# Patient Record
Sex: Female | Born: 1955 | Race: White | Hispanic: No | Marital: Single | Smoking: Current every day smoker
Health system: Southern US, Community
[De-identification: ages and names within clinical notes are randomized; demographics above are authoritative.]

## PROBLEM LIST (undated history)

## (undated) DIAGNOSIS — Z720 Tobacco use: Secondary | ICD-10-CM

## (undated) DIAGNOSIS — Z9289 Personal history of other medical treatment: Secondary | ICD-10-CM

## (undated) DIAGNOSIS — C539 Malignant neoplasm of cervix uteri, unspecified: Secondary | ICD-10-CM

## (undated) DIAGNOSIS — E785 Hyperlipidemia, unspecified: Secondary | ICD-10-CM

## (undated) DIAGNOSIS — E119 Type 2 diabetes mellitus without complications: Secondary | ICD-10-CM

## (undated) DIAGNOSIS — E669 Obesity, unspecified: Secondary | ICD-10-CM

## (undated) DIAGNOSIS — I10 Essential (primary) hypertension: Secondary | ICD-10-CM

## (undated) DIAGNOSIS — I998 Other disorder of circulatory system: Secondary | ICD-10-CM

## (undated) DIAGNOSIS — R0601 Orthopnea: Secondary | ICD-10-CM

## (undated) DIAGNOSIS — J449 Chronic obstructive pulmonary disease, unspecified: Secondary | ICD-10-CM

## (undated) DIAGNOSIS — I70229 Atherosclerosis of native arteries of extremities with rest pain, unspecified extremity: Secondary | ICD-10-CM

## (undated) DIAGNOSIS — I739 Peripheral vascular disease, unspecified: Secondary | ICD-10-CM

## (undated) HISTORY — DX: Hyperlipidemia, unspecified: E78.5

## (undated) HISTORY — DX: Other disorder of circulatory system: I99.8

## (undated) HISTORY — DX: Obesity, unspecified: E66.9

## (undated) HISTORY — DX: Atherosclerosis of native arteries of extremities with rest pain, unspecified extremity: I70.229

## (undated) HISTORY — DX: Tobacco use: Z72.0

---

## 2004-04-24 DIAGNOSIS — Z9289 Personal history of other medical treatment: Secondary | ICD-10-CM

## 2004-04-24 HISTORY — PX: CERVIX SURGERY: SHX593

## 2004-04-24 HISTORY — DX: Personal history of other medical treatment: Z92.89

## 2011-11-13 ENCOUNTER — Other Ambulatory Visit (HOSPITAL_COMMUNITY): Payer: Self-pay | Admitting: Internal Medicine

## 2011-11-13 DIAGNOSIS — Z139 Encounter for screening, unspecified: Secondary | ICD-10-CM

## 2011-11-21 ENCOUNTER — Ambulatory Visit (HOSPITAL_COMMUNITY)
Admission: RE | Admit: 2011-11-21 | Discharge: 2011-11-21 | Disposition: A | Discharge status: Home / Self Care | Payer: Medicare Other | Source: Ambulatory Visit | Attending: Internal Medicine | Admitting: Internal Medicine

## 2011-11-21 DIAGNOSIS — Z139 Encounter for screening, unspecified: Secondary | ICD-10-CM

## 2011-11-21 DIAGNOSIS — Z1231 Encounter for screening mammogram for malignant neoplasm of breast: Secondary | ICD-10-CM | POA: Insufficient documentation

## 2012-02-18 ENCOUNTER — Emergency Department (HOSPITAL_COMMUNITY)
Admission: EM | Admit: 2012-02-18 | Discharge: 2012-02-18 | Disposition: A | Discharge status: Home / Self Care | Payer: Medicare Other | Attending: Emergency Medicine | Admitting: Emergency Medicine

## 2012-02-18 ENCOUNTER — Encounter (HOSPITAL_COMMUNITY): Payer: Self-pay | Admitting: Emergency Medicine

## 2012-02-18 DIAGNOSIS — H6691 Otitis media, unspecified, right ear: Secondary | ICD-10-CM

## 2012-02-18 DIAGNOSIS — J3489 Other specified disorders of nose and nasal sinuses: Secondary | ICD-10-CM | POA: Insufficient documentation

## 2012-02-18 DIAGNOSIS — R059 Cough, unspecified: Secondary | ICD-10-CM | POA: Insufficient documentation

## 2012-02-18 DIAGNOSIS — H6690 Otitis media, unspecified, unspecified ear: Secondary | ICD-10-CM | POA: Insufficient documentation

## 2012-02-18 DIAGNOSIS — E119 Type 2 diabetes mellitus without complications: Secondary | ICD-10-CM | POA: Insufficient documentation

## 2012-02-18 DIAGNOSIS — J449 Chronic obstructive pulmonary disease, unspecified: Secondary | ICD-10-CM | POA: Insufficient documentation

## 2012-02-18 DIAGNOSIS — R05 Cough: Secondary | ICD-10-CM | POA: Insufficient documentation

## 2012-02-18 DIAGNOSIS — I10 Essential (primary) hypertension: Secondary | ICD-10-CM | POA: Insufficient documentation

## 2012-02-18 DIAGNOSIS — F17200 Nicotine dependence, unspecified, uncomplicated: Secondary | ICD-10-CM | POA: Insufficient documentation

## 2012-02-18 DIAGNOSIS — Z859 Personal history of malignant neoplasm, unspecified: Secondary | ICD-10-CM | POA: Insufficient documentation

## 2012-02-18 DIAGNOSIS — J4489 Other specified chronic obstructive pulmonary disease: Secondary | ICD-10-CM | POA: Insufficient documentation

## 2012-02-18 HISTORY — DX: Chronic obstructive pulmonary disease, unspecified: J44.9

## 2012-02-18 HISTORY — DX: Essential (primary) hypertension: I10

## 2012-02-18 MED ORDER — CEFTRIAXONE SODIUM 1 G IJ SOLR
1.0000 g | Freq: Once | INTRAMUSCULAR | Status: AC
  Administered 2012-02-18: 1 g via INTRAMUSCULAR
  Filled 2012-02-18: qty 10

## 2012-02-18 MED ORDER — HYDROCOD POLST-CHLORPHEN POLST 10-8 MG/5ML PO LQCR
5.0000 mL | Freq: Once | ORAL | Status: AC
  Administered 2012-02-18: 5 mL via ORAL
  Filled 2012-02-18: qty 5

## 2012-02-18 MED ORDER — LIDOCAINE HCL (PF) 1 % IJ SOLN
INTRAMUSCULAR | Status: AC
  Administered 2012-02-18: 2.1 mL
  Filled 2012-02-18: qty 5

## 2012-02-18 MED ORDER — GUAIFENESIN-CODEINE 100-10 MG/5ML PO SYRP: ORAL_SOLUTION | ORAL | Status: DC

## 2012-02-18 MED ORDER — AZITHROMYCIN 250 MG PO TABS: ORAL_TABLET | ORAL | Status: DC

## 2012-02-18 MED ORDER — IBUPROFEN 800 MG PO TABS
800.0000 mg | ORAL_TABLET | Freq: Once | ORAL | Status: AC
  Administered 2012-02-18: 800 mg via ORAL
  Filled 2012-02-18: qty 1

## 2012-02-18 MED ORDER — AZITHROMYCIN 250 MG PO TABS
500.0000 mg | ORAL_TABLET | Freq: Once | ORAL | Status: AC
  Administered 2012-02-18: 500 mg via ORAL
  Filled 2012-02-18: qty 2

## 2012-02-18 NOTE — ED Provider Notes (Signed)
History     CSN: [id number]  Arrival date & time 02/18/12  1319   First MD Initiated Contact with Patient 02/18/12 1551      Chief Complaint  Patient presents with  . Nasal Congestion  . Otalgia    (Consider location/radiation/quality/duration/timing/severity/associated sxs/prior treatment) HPI Comments: Has PCP in [place].  Patient is a 56 y.o. female presenting with ear pain. The history is provided by the patient. No language interpreter was used.  Otalgia This is a new problem. Episode onset: 4 days ago. There is pain in the right ear. The problem occurs constantly. There has been no fever. The pain is moderate. Associated symptoms include headaches, hearing loss and cough. Pertinent negatives include no ear discharge, no sore throat and no vomiting. Associated symptoms comments: Back pain from coughing and post-tussive retching..    Past Medical History  Diagnosis Date  . Hypertension   . Diabetes mellitus without complication   . COPD (chronic obstructive pulmonary disease)   . Cancer     History reviewed. No pertinent past surgical history.  No family history on file.  History  Substance Use Topics  . Smoking status: Current Every Day Smoker -- 0.5 packs/day  . Smokeless tobacco: Not on file  . Alcohol Use: No    OB History    Grav Para Term Preterm Abortions TAB SAB Ect Mult Living                  Review of Systems  Constitutional: Positive for chills. Negative for fever.  HENT: Positive for hearing loss, ear pain and congestion. Negative for sore throat and ear discharge.   Respiratory: Positive for cough. Negative for shortness of breath and wheezing.   Gastrointestinal: Negative for nausea and vomiting.  Musculoskeletal: Positive for back pain.  Neurological: Positive for headaches.  All other systems reviewed and are negative.    Allergies  Review of patient's allergies indicates no known allergies.  Home Medications   Current Outpatient Rx    Name Route Sig Dispense Refill  . ALBUTEROL SULFATE HFA 108 (90 BASE) MCG/ACT IN AERS Inhalation Inhale 2 puffs into the lungs every 6 (six) hours as needed.    [name] LISINOPRIL 10 MG PO TABS Oral Take 10 mg by mouth daily.    [name] METFORMIN HCL 1000 MG PO TABS Oral Take 1,000 mg by mouth 3 (three) times daily.    [name] TIOTROPIUM BROMIDE MONOHYDRATE 18 MCG IN CAPS Inhalation Place 18 mcg into inhaler and inhale daily.    . AZITHROMYCIN 250 MG PO TABS  One tab po QD (initial dose given in ED) 4 tablet 0  . GUAIFENESIN-CODEINE 100-10 MG/5ML PO SYRP  10 ml po q 4-6 hrs prn cough 240 mL 0    BP 137/76  Pulse 73  Temp 98.1 F (36.7 C)  Resp 16  Ht 5\' 8"  (1.727 m)  Wt 209 lb (94.802 kg)  BMI 31.78 kg/m2  SpO2 93%  Physical Exam  Nursing note and vitals reviewed. Constitutional: She is oriented to person, place, and time. Vital signs are normal. She appears well-developed and well-nourished. She is cooperative.  Non-toxic appearance. She does not have a sickly appearance. She appears ill. No distress.  HENT:  Head: Normocephalic and atraumatic.  Right Ear: External ear and ear canal normal. No drainage. Tympanic membrane is injected and bulging. A middle ear effusion is present. Decreased hearing is noted.  Left Ear: Hearing, tympanic membrane, external ear and ear canal normal.  Nose:  Nose normal.  Mouth/Throat: No oropharyngeal exudate.  Eyes: EOM are normal.  Neck: Normal range of motion.  Cardiovascular: Normal rate, regular rhythm and normal heart sounds.   Pulmonary/Chest: Breath sounds normal. No accessory muscle usage. Tachypnea noted. No respiratory distress. She has no decreased breath sounds. She has no wheezes. She has no rhonchi. She has no rales. She exhibits no tenderness.  Abdominal: Soft. She exhibits no distension. There is no tenderness.  Musculoskeletal: Normal range of motion.  Neurological: She is alert and oriented to person, place, and time.  Skin: Skin is warm and dry.   Psychiatric: She has a normal mood and affect. Judgment normal.    ED Course  Procedures (including critical care time)  Labs Reviewed - No data to display No results found.   1. Right otitis media   2. Cough       MDM  rx- zithromax 250 mg x 4 days Rocephin 1 gm IM rx-robitussin AC Ibuprofen 800 mg TID F/u with PCP        Evalina Field, PA 02/18/12 1626

## 2012-02-18 NOTE — ED Notes (Signed)
Cough, congestion and diminished hearing R ear x 4days.  Lungs clear but diminished bilaterally.  R ear slightly erythematous canal and area on TM. ? Serous drainage at 5:00 margin of TM.

## 2012-02-18 NOTE — ED Provider Notes (Signed)
Medical screening examination/treatment/procedure(s) were performed by non-physician practitioner and as supervising physician I was immediately available for consultation/collaboration.   Gracianna Vink L Kalil Woessner, MD 02/18/12 2245 

## 2012-02-18 NOTE — ED Notes (Signed)
Patient with no complaints at this time. Respirations even and unlabored. Skin warm/dry. Discharge instructions reviewed with patient at this time. Patient given opportunity to voice concerns/ask questions. Patient discharged at this time and left Emergency Department with steady gait.   

## 2012-02-18 NOTE — ED Notes (Signed)
Pt c/o head/nasal congestion and right ear pain x 1 week

## 2012-11-05 ENCOUNTER — Other Ambulatory Visit (HOSPITAL_COMMUNITY): Payer: Self-pay | Admitting: Internal Medicine

## 2012-11-05 DIAGNOSIS — Z139 Encounter for screening, unspecified: Secondary | ICD-10-CM

## 2012-11-25 ENCOUNTER — Ambulatory Visit (HOSPITAL_COMMUNITY)
Admission: RE | Admit: 2012-11-25 | Discharge: 2012-11-25 | Disposition: A | Discharge status: Home / Self Care | Payer: Medicare Other | Source: Ambulatory Visit | Attending: Internal Medicine | Admitting: Internal Medicine

## 2012-11-25 DIAGNOSIS — Z139 Encounter for screening, unspecified: Secondary | ICD-10-CM

## 2012-11-25 DIAGNOSIS — Z1231 Encounter for screening mammogram for malignant neoplasm of breast: Secondary | ICD-10-CM | POA: Insufficient documentation

## 2013-03-24 ENCOUNTER — Ambulatory Visit (INDEPENDENT_AMBULATORY_CARE_PROVIDER_SITE_OTHER): Payer: Medicare Other | Admitting: Podiatry

## 2013-03-24 ENCOUNTER — Encounter: Payer: Self-pay | Admitting: Podiatry

## 2013-03-24 VITALS — BP 114/62 | HR 97 | Resp 22 | Ht 68.0 in | Wt 240.0 lb

## 2013-03-24 DIAGNOSIS — E1159 Type 2 diabetes mellitus with other circulatory complications: Secondary | ICD-10-CM

## 2013-03-24 DIAGNOSIS — B351 Tinea unguium: Secondary | ICD-10-CM

## 2013-03-24 DIAGNOSIS — M79609 Pain in unspecified limb: Secondary | ICD-10-CM

## 2013-03-24 NOTE — Progress Notes (Signed)
   Subjective:    Patient ID: Jackie Evans, female    DOB: Jan 22, 1956, 57 y.o.   MRN: [id number]  HPI Comments: "My right foot just gets red and burns and stings. It's kinda splotchy"  N - burning, stinging L - left foot  D - few mos O - gradual C - red, splotchy areas, worse A - shoes T - PCP gave "cream"? (no help)  -Pt has trouble trimming toenails too-       Review of Systems  Respiratory: Positive for shortness of breath.   Cardiovascular: Positive for leg swelling.  All other systems reviewed and are negative.       Objective:   Physical Exam        Assessment & Plan:

## 2013-03-24 NOTE — Patient Instructions (Signed)
Diabetes and Foot Care Diabetes may cause you to have problems because of poor blood supply (circulation) to your feet and legs. This may cause the skin on your feet to become thinner, break easier, and heal more slowly. Your skin may become dry, and the skin may peel and crack. You may also have nerve damage in your legs and feet causing decreased feeling in them. You may not notice minor injuries to your feet that could lead to infections or more serious problems. Taking care of your feet is one of the most important things you can do for yourself.  HOME CARE INSTRUCTIONS  Wear shoes at all times, even in the house. Do not go barefoot. Bare feet are easily injured.  Check your feet daily for blisters, cuts, and redness. If you cannot see the bottom of your feet, use a mirror or ask someone for help.  Wash your feet with warm water (do not use hot water) and mild soap. Then pat your feet and the areas between your toes until they are completely dry. Do not soak your feet as this can dry your skin.  Apply a moisturizing lotion or petroleum jelly (that does not contain alcohol and is unscented) to the skin on your feet and to dry, brittle toenails. Do not apply lotion between your toes.  Trim your toenails straight across. Do not dig under them or around the cuticle. File the edges of your nails with an emery board or nail file.  Do not cut corns or calluses or try to remove them with medicine.  Wear clean socks or stockings every day. Make sure they are not too tight. Do not wear knee-high stockings since they may decrease blood flow to your legs.  Wear shoes that fit properly and have enough cushioning. To break in new shoes, wear them for just a few hours a day. This prevents you from injuring your feet. Always look in your shoes before you put them on to be sure there are no objects inside.  Do not cross your legs. This may decrease the blood flow to your feet.  If you find a minor scrape,  cut, or break in the skin on your feet, keep it and the skin around it clean and dry. These areas may be cleansed with mild soap and water. Do not cleanse the area with peroxide, alcohol, or iodine.  When you remove an adhesive bandage, be sure not to damage the skin around it.  If you have a wound, look at it several times a day to make sure it is healing.  Do not use heating pads or hot water bottles. They may burn your skin. If you have lost feeling in your feet or legs, you may not know it is happening until it is too late.  Make sure your health care provider performs a complete foot exam at least annually or more often if you have foot problems. Report any cuts, sores, or bruises to your health care provider immediately. SEEK MEDICAL CARE IF:   You have an injury that is not healing.  You have cuts or breaks in the skin.  You have an ingrown nail.  You notice redness on your legs or feet.  You feel burning or tingling in your legs or feet.  You have pain or cramps in your legs and feet.  Your legs or feet are numb.  Your feet always feel cold. SEEK IMMEDIATE MEDICAL CARE IF:   There is increasing redness,   swelling, or pain in or around a wound.  There is a red line that goes up your leg.  Pus is coming from a wound.  You develop a fever or as directed by your health care provider.  You notice a bad smell coming from an ulcer or wound. Document Released: 04/07/2000 Document Revised: 12/11/2012 Document Reviewed: 09/17/2012 Exit[employer] Patient Information 2014 [employer].  

## 2013-03-26 NOTE — Progress Notes (Signed)
Subjective:     Patient ID: Jackie Evans, female   DOB: 04-04-1956, 57 y.o.   MRN: [id number]  Foot Pain   patient presents in very poor health using oxygen and a history of diabetes. She is getting some red splotchy spots on her feet and is concerned because her left foot and lower leg is becoming painful. Also complains about her nails that she cannot cut them and they get tender in the corners   Review of Systems  All other systems reviewed and are negative.       Objective:   Physical Exam  Nursing note and vitals reviewed. Constitutional: She is oriented to person, place, and time. She appears well-nourished.  Musculoskeletal: Normal range of motion.  Neurological: She is oriented to person, place, and time.  Skin: Skin is dry.   neurovascular status is diminished left over right with nonpalpable pulses on the left side and coolness to the foot noted muscle strength is diminished and equinus condition noted of both feet. Red discoloration of the foot noted left over right with diminishment appear growth noted on both feet. Nail disease with thickness 1-5 both feet     Assessment:     At risk diabetic with possibility for vascular disease of the left leg which may be causing discoloration and nail disease 1-5 both feet with pain    Plan:     H&P performed and conditions discussed and explained to patient. I have requested vascular evaluation of the left lower extremity with possibility for vascular disease present debrided nailbeds 1-5 both feet and advised if she does not hear from the vascular testing center within the next 5-7 days to contact us back for appointment

## 2013-03-27 ENCOUNTER — Ambulatory Visit (INDEPENDENT_AMBULATORY_CARE_PROVIDER_SITE_OTHER): Payer: Medicare Other | Admitting: Cardiovascular Disease

## 2013-03-27 ENCOUNTER — Encounter (HOSPITAL_COMMUNITY): Payer: Self-pay | Admitting: Cardiovascular Disease

## 2013-03-27 ENCOUNTER — Encounter: Payer: Self-pay | Admitting: Cardiovascular Disease

## 2013-03-27 ENCOUNTER — Telehealth: Payer: Self-pay | Admitting: *Deleted

## 2013-03-27 ENCOUNTER — Ambulatory Visit (HOSPITAL_COMMUNITY)
Admission: RE | Admit: 2013-03-27 | Discharge: 2013-03-27 | Disposition: A | Discharge status: Home / Self Care | Payer: Medicare Other | Source: Ambulatory Visit | Attending: Podiatry | Admitting: Podiatry

## 2013-03-27 VITALS — BP 148/62 | HR 96 | Ht 68.0 in | Wt 241.0 lb

## 2013-03-27 DIAGNOSIS — E1159 Type 2 diabetes mellitus with other circulatory complications: Secondary | ICD-10-CM

## 2013-03-27 DIAGNOSIS — R5383 Other fatigue: Secondary | ICD-10-CM

## 2013-03-27 DIAGNOSIS — Z01818 Encounter for other preprocedural examination: Secondary | ICD-10-CM

## 2013-03-27 DIAGNOSIS — E119 Type 2 diabetes mellitus without complications: Secondary | ICD-10-CM | POA: Insufficient documentation

## 2013-03-27 DIAGNOSIS — I10 Essential (primary) hypertension: Secondary | ICD-10-CM | POA: Insufficient documentation

## 2013-03-27 DIAGNOSIS — R5381 Other malaise: Secondary | ICD-10-CM

## 2013-03-27 DIAGNOSIS — I739 Peripheral vascular disease, unspecified: Secondary | ICD-10-CM | POA: Insufficient documentation

## 2013-03-27 DIAGNOSIS — I998 Other disorder of circulatory system: Secondary | ICD-10-CM | POA: Insufficient documentation

## 2013-03-27 DIAGNOSIS — M79609 Pain in unspecified limb: Secondary | ICD-10-CM | POA: Insufficient documentation

## 2013-03-27 DIAGNOSIS — I70219 Atherosclerosis of native arteries of extremities with intermittent claudication, unspecified extremity: Secondary | ICD-10-CM

## 2013-03-27 DIAGNOSIS — D689 Coagulation defect, unspecified: Secondary | ICD-10-CM

## 2013-03-27 DIAGNOSIS — J449 Chronic obstructive pulmonary disease, unspecified: Secondary | ICD-10-CM

## 2013-03-27 DIAGNOSIS — E785 Hyperlipidemia, unspecified: Secondary | ICD-10-CM | POA: Insufficient documentation

## 2013-03-27 DIAGNOSIS — Z79899 Other long term (current) drug therapy: Secondary | ICD-10-CM

## 2013-03-27 DIAGNOSIS — R0989 Other specified symptoms and signs involving the circulatory and respiratory systems: Secondary | ICD-10-CM

## 2013-03-27 NOTE — Assessment & Plan Note (Signed)
Controlled on current medications 

## 2013-03-27 NOTE — Assessment & Plan Note (Signed)
40-80 pack years currently smoking one half pack per day. Oxygen dependent.

## 2013-03-27 NOTE — Assessment & Plan Note (Signed)
On statin therapy followed by her PCP 

## 2013-03-27 NOTE — Assessment & Plan Note (Signed)
1-2 month history of left lower extremity pain both at rest and with ambulation. She has dependent rubor. She is was referred by Dr. Cristie Hem , from [facility], for further peripheral vascular evaluation. Lower extremity arterial Dopplers performed today revealed a right ABI of 0.90 L. 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 She has no palpable left femoral pulse.

## 2013-03-27 NOTE — Telephone Encounter (Signed)
Rita - [facility] on [address] states pt has + arterial doppler test of left leg, would Dr Charlsie Merles like Dr Allyson Sabal to see.  I informed Trego Sink, DR Charlsie Merles agreed.  I informed Dr. Charlsie Merles.

## 2013-03-27 NOTE — Progress Notes (Signed)
Arterial Duplex Left Lower Ext. Completed. Jaryn Hocutt, BS, RDMS, RVT  

## 2013-03-27 NOTE — Progress Notes (Signed)
   03/27/2013 Jackie Evans   09/02/1955  [id number]  Primary Physician AVBUERE,EDWIN A, MD Primary Cardiologist: Charlina Dwight J. Ziyanna Tolin MD FACP,FACC,FAHA, FSCAI   HPI:  Jackie Evans is a 57-year-old moderately overweight divorced Caucasian female mother of 4, grandmother to 11 grandchildren who is referred by Dr. Regal for peripheral vascular evaluation because of critical limb ischemia. Her primary care physician is Dr. Edwin Avbeure. Her risk factors include tobacco abuse of 40-80 pack years with COPD. She does continue to smoke one half pack per day. She is treated diabetes, hypertension and hyperlipidemia. She's never had a heart attack or stroke. She denies chest pain but is short of breath. She's had left lower extremity discomfort for one to 2 months but the rest and with exertion. Her left ABI is 0.66.   Current Outpatient Prescriptions  Medication Sig Dispense Refill  . Albuterol Sulfate (PROAIR HFA IN) Inhale into the lungs.      . AMITRIPTYLINE HCL PO Take by mouth.      . gabapentin (NEURONTIN) 300 MG capsule Take 300 mg by mouth 3 (three) times daily.      . glipiZIDE (GLUCOTROL XL) 5 MG 24 hr tablet Take 5 mg by mouth daily with breakfast.      . lisinopril-hydrochlorothiazide (PRINZIDE,ZESTORETIC) 20-12.5 MG per tablet       . metFORMIN (GLUCOPHAGE) 1000 MG tablet Take 500-1,000 mg by mouth 3 (three) times daily. Patient takes 1 tablet in the morning, 1/2 tablet in the evening and 1 tablet at bedtime      . simvastatin (ZOCOR) 10 MG tablet Take 10 mg by mouth daily.       No current facility-administered medications for this visit.    No Known Allergies  History   Social History  . Marital Status: Single    Spouse Name: N/A    Number of Children: N/A  . Years of Education: N/A   Occupational History  . Not on file.   Social History Main Topics  . Smoking status: Current Every Day Smoker -- 0.50 packs/day  . Smokeless tobacco: Not on file  . Alcohol Use: No  . Drug  Use: No  . Sexual Activity:    Other Topics Concern  . Not on file   Social History Narrative  . No narrative on file     Review of Systems: General: negative for chills, fever, night sweats or weight changes.  Cardiovascular: negative for chest pain, dyspnea on exertion, edema, orthopnea, palpitations, paroxysmal nocturnal dyspnea or shortness of breath Dermatological: negative for rash Respiratory: negative for cough or wheezing Urologic: negative for hematuria Abdominal: negative for nausea, vomiting, diarrhea, bright red blood per rectum, melena, or hematemesis Neurologic: negative for visual changes, syncope, or dizziness All other systems reviewed and are otherwise negative except as noted above.    Blood pressure 148/62, pulse 96, height 5' 8" (1.727 m), weight 241 lb (109.317 kg).  General appearance: alert and no distress Neck: no adenopathy, no carotid bruit, no JVD, supple, symmetrical, trachea midline and thyroid not enlarged, symmetric, no tenderness/mass/nodules Lungs: clear to auscultation bilaterally Heart: regular rate and rhythm, S1, S2 normal, no murmur, click, rub or gallop Extremities: 2+ right and absent left pedal pulse. There was dependent rubor on the left. Pulses: 2+ right, absent left.  EKG not performed today  ASSESSMENT AND PLAN:   COPD (chronic obstructive pulmonary disease) 40-80 pack years currently smoking one half pack per day. Oxygen dependent.  Hyperlipidemia On statin therapy followed by   her PCP  Essential hypertension Controlled on current medications  Critical lower limb ischemia 1-2 month history of left lower extremity pain both at rest and with ambulation. She has dependent rubor. She is was referred by Dr. Norman Regal , from [facility], for further peripheral vascular evaluation. Lower extremity arterial Dopplers performed today revealed a right ABI of 0.90 L. 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 She has no palpable left femoral pulse.      [name] J. Demitris Pokorny MD FACP,FACC,FAHA, FSCAI 03/27/2013 3:12 PM  

## 2013-03-27 NOTE — Patient Instructions (Signed)
Dr. Allyson Sabal has ordered a peripheral angiogram to be done at [facility].  This procedure is going to look at the bloodflow in your lower extremities.  If Dr. Allyson Sabal is able to open up the arteries, you will have to spend one night in the hospital.  If he is not able to open the arteries, you will be able to go home that same day.    After the procedure, you will not be allowed to drive for 3 days or push, pull, or lift anything greater than 10 lbs for one week.    You will be required to have bloodwork and a chest xray prior to your procedure.  Our scheduler will advise you on when these items need to be done.    REPS Scott     Dr Allyson Sabal wants you to have an echocardiogram and lexiscan myoview prior to the angiogram.

## 2013-03-28 ENCOUNTER — Telehealth (HOSPITAL_COMMUNITY): Payer: Self-pay | Admitting: *Deleted

## 2013-04-03 ENCOUNTER — Ambulatory Visit (HOSPITAL_COMMUNITY)
Admission: RE | Admit: 2013-04-03 | Discharge: 2013-04-03 | Disposition: A | Discharge status: Home / Self Care | Payer: Medicare Other | Source: Ambulatory Visit | Attending: Cardiovascular Disease | Admitting: Cardiovascular Disease

## 2013-04-03 ENCOUNTER — Ambulatory Visit (HOSPITAL_COMMUNITY)
Admission: RE | Admit: 2013-04-03 | Discharge: 2013-04-03 | Disposition: A | Discharge status: Home / Self Care | Payer: Medicare Other | Source: Ambulatory Visit | Attending: Internal Medicine | Admitting: Internal Medicine

## 2013-04-03 ENCOUNTER — Encounter (HOSPITAL_COMMUNITY): Payer: Self-pay | Admitting: Pharmacy Technician

## 2013-04-03 DIAGNOSIS — R079 Chest pain, unspecified: Secondary | ICD-10-CM | POA: Insufficient documentation

## 2013-04-03 DIAGNOSIS — R0609 Other forms of dyspnea: Secondary | ICD-10-CM | POA: Insufficient documentation

## 2013-04-03 DIAGNOSIS — F17200 Nicotine dependence, unspecified, uncomplicated: Secondary | ICD-10-CM | POA: Insufficient documentation

## 2013-04-03 DIAGNOSIS — R5381 Other malaise: Secondary | ICD-10-CM | POA: Insufficient documentation

## 2013-04-03 DIAGNOSIS — E669 Obesity, unspecified: Secondary | ICD-10-CM | POA: Insufficient documentation

## 2013-04-03 DIAGNOSIS — R0989 Other specified symptoms and signs involving the circulatory and respiratory systems: Secondary | ICD-10-CM | POA: Insufficient documentation

## 2013-04-03 DIAGNOSIS — J4489 Other specified chronic obstructive pulmonary disease: Secondary | ICD-10-CM | POA: Insufficient documentation

## 2013-04-03 DIAGNOSIS — E119 Type 2 diabetes mellitus without complications: Secondary | ICD-10-CM | POA: Insufficient documentation

## 2013-04-03 DIAGNOSIS — Z01810 Encounter for preprocedural cardiovascular examination: Secondary | ICD-10-CM

## 2013-04-03 DIAGNOSIS — R42 Dizziness and giddiness: Secondary | ICD-10-CM | POA: Insufficient documentation

## 2013-04-03 DIAGNOSIS — I739 Peripheral vascular disease, unspecified: Secondary | ICD-10-CM

## 2013-04-03 DIAGNOSIS — Z01818 Encounter for other preprocedural examination: Secondary | ICD-10-CM

## 2013-04-03 DIAGNOSIS — J449 Chronic obstructive pulmonary disease, unspecified: Secondary | ICD-10-CM

## 2013-04-03 DIAGNOSIS — I10 Essential (primary) hypertension: Secondary | ICD-10-CM | POA: Insufficient documentation

## 2013-04-03 LAB — BASIC METABOLIC PANEL
BUN: 13 mg/dL (ref 6–23)
Calcium: 9.8 mg/dL (ref 8.4–10.5)
Glucose, Bld: 144 mg/dL — ABNORMAL HIGH (ref 70–99)
Sodium: 140 mEq/L (ref 135–145)

## 2013-04-03 LAB — CBC
HCT: 39 % (ref 36.0–46.0)
Platelets: 183 10*3/uL (ref 150–400)
RDW: 15.7 % — ABNORMAL HIGH (ref 11.5–15.5)
WBC: 7 10*3/uL (ref 4.0–10.5)

## 2013-04-03 LAB — PROTIME-INR
INR: 0.95 (ref <1.50)
Prothrombin Time: 12.6 seconds (ref 11.6–15.2)

## 2013-04-03 MED ORDER — AMINOPHYLLINE 25 MG/ML IV SOLN
75.0000 mg | Freq: Once | INTRAVENOUS | Status: AC
  Administered 2013-04-03: 75 mg via INTRAVENOUS

## 2013-04-03 MED ORDER — TECHNETIUM TC 99M SESTAMIBI GENERIC - CARDIOLITE
30.0000 | Freq: Once | INTRAVENOUS | Status: AC | PRN
  Administered 2013-04-03: 30 via INTRAVENOUS

## 2013-04-03 MED ORDER — REGADENOSON 0.4 MG/5ML IV SOLN
0.4000 mg | Freq: Once | INTRAVENOUS | Status: AC
  Administered 2013-04-03: 0.4 mg via INTRAVENOUS

## 2013-04-03 MED ORDER — TECHNETIUM TC 99M SESTAMIBI GENERIC - CARDIOLITE
10.0000 | Freq: Once | INTRAVENOUS | Status: AC | PRN
  Administered 2013-04-03: 10 via INTRAVENOUS

## 2013-04-03 NOTE — Procedures (Addendum)
[facility] [address] [place] [address] [place] [address] [phone]  Cardiology Nuclear Med Study  Jackie Evans is a 57 y.o. female     MRN : [id number]     DOB: 12-05-1955  Procedure Date: 04/03/2013  Nuclear Med Background Indication for Stress Test:  Surgical Clearance History:  COPD Cardiac Risk Factors: Hypertension, Lipids, NIDDM, Obesity, PVD and Smoker  Symptoms:  Chest Pain, DOE, Fatigue, Light-Headedness and SOB   Nuclear Pre-Procedure Caffeine/Decaff Intake:  8:00pm NPO After: 6:00am   IV Site: R Forearm  IV 0.9% NS with Angio Cath:  22g  Chest Size (in):  N/A IV Started by: Emmit Pomfret, RN  Height: 5\' 8"  (1.727 m)  Cup Size: C  BMI:  Body mass index is 36.65 kg/(m^2). Weight:  241 lb (109.317 kg)   Tech Comments:  N/A    Nuclear Med Study 1 or 2 day study: 1 day  Stress Test Type:  Lexiscan  Order Authorizing Provider:  Nanetta Batty, MD   Resting Radionuclide: Technetium 34m Sestamibi  Resting Radionuclide Dose: 9.6 mCi   Stress Radionuclide:  Technetium 64m Sestamibi  Stress Radionuclide Dose: 29.1 mCi           Stress Protocol Rest HR: 68 Stress HR: 99  Rest BP: 126/73 Stress BP: 150/72  Exercise Time (min): n/a METS: n/a   Predicted Max HR: 163 bpm % Max HR: 60.74 bpm Rate Pressure Product: 34742  Dose of Adenosine (mg):  n/a Dose of Lexiscan: 0.4 mg  Dose of Atropine (mg): n/a Dose of Dobutamine: n/a mcg/kg/min (at max HR)  Stress Test Technologist: Esperanza Sheets, CCT Nuclear Technologist: Koren Shiver, CNMT   Rest Procedure:  Myocardial perfusion imaging was performed at rest 45 minutes following the intravenous administration of Technetium 72m Sestamibi. Stress Procedure:  The patient received IV Lexiscan 0.4 mg over 15-seconds.  Technetium 53m Sestamibi injected at 30-seconds.  The patient experienced marked SOB; 125 mg IV Aminophylline with resolution of symptoms.  There were no  significant changes with Lexiscan.  Quantitative spect images were obtained after a 45 minute delay.  Transient Ischemic Dilatation (Normal <1.22):  0.86 Lung/Heart Ratio (Normal <0.45):  0.25 QGS EDV:  61 ml QGS ESV:  8 ml LV Ejection Fraction: 87%  Signed by      Rest ECG: NSR - Normal EKG  Stress ECG: No significant change from baseline ECG  QPS Raw Data Images:  Normal; no motion artifact; normal heart/lung ratio. Stress Images:  Normal homogeneous uptake in all areas of the myocardium. Rest Images:  Normal homogeneous uptake in all areas of the myocardium. Subtraction (SDS):  Normal  Impression Exercise Capacity:  Lexiscan with no exercise. BP Response:  Normal blood pressure response. Clinical Symptoms:  Marked shortness of breath ECG Impression:  No significant ST segment change suggestive of ischemia. Comparison with Prior Nuclear Study: No images to compare  Overall Impression:  Normal stress nuclear study.  LV Wall Motion:  NL LV Function; NL Wall Motion   Lennette Bihari, MD  04/03/2013 12:47 PM

## 2013-04-07 ENCOUNTER — Encounter: Payer: Self-pay | Admitting: *Deleted

## 2013-04-08 ENCOUNTER — Encounter (HOSPITAL_COMMUNITY): Payer: Self-pay | Admitting: General Practice

## 2013-04-08 ENCOUNTER — Encounter (HOSPITAL_COMMUNITY)
Admission: RE | Disposition: A | Discharge status: Home / Self Care | Payer: Self-pay | Source: Ambulatory Visit | Attending: Cardiovascular Disease

## 2013-04-08 ENCOUNTER — Ambulatory Visit (HOSPITAL_COMMUNITY)
Admission: RE | Admit: 2013-04-08 | Discharge: 2013-04-09 | Disposition: A | Discharge status: Home / Self Care | Payer: Medicare Other | Source: Ambulatory Visit | Attending: Cardiovascular Disease | Admitting: Cardiovascular Disease

## 2013-04-08 DIAGNOSIS — E663 Overweight: Secondary | ICD-10-CM | POA: Insufficient documentation

## 2013-04-08 DIAGNOSIS — Z959 Presence of cardiac and vascular implant and graft, unspecified: Secondary | ICD-10-CM

## 2013-04-08 DIAGNOSIS — I998 Other disorder of circulatory system: Secondary | ICD-10-CM

## 2013-04-08 DIAGNOSIS — Z01818 Encounter for other preprocedural examination: Secondary | ICD-10-CM

## 2013-04-08 DIAGNOSIS — I70219 Atherosclerosis of native arteries of extremities with intermittent claudication, unspecified extremity: Secondary | ICD-10-CM | POA: Insufficient documentation

## 2013-04-08 DIAGNOSIS — E785 Hyperlipidemia, unspecified: Secondary | ICD-10-CM | POA: Insufficient documentation

## 2013-04-08 DIAGNOSIS — J4489 Other specified chronic obstructive pulmonary disease: Secondary | ICD-10-CM | POA: Insufficient documentation

## 2013-04-08 DIAGNOSIS — I745 Embolism and thrombosis of iliac artery: Secondary | ICD-10-CM | POA: Insufficient documentation

## 2013-04-08 DIAGNOSIS — E119 Type 2 diabetes mellitus without complications: Secondary | ICD-10-CM | POA: Insufficient documentation

## 2013-04-08 DIAGNOSIS — I10 Essential (primary) hypertension: Secondary | ICD-10-CM

## 2013-04-08 DIAGNOSIS — J449 Chronic obstructive pulmonary disease, unspecified: Secondary | ICD-10-CM | POA: Diagnosis present

## 2013-04-08 DIAGNOSIS — F17200 Nicotine dependence, unspecified, uncomplicated: Secondary | ICD-10-CM | POA: Insufficient documentation

## 2013-04-08 DIAGNOSIS — I739 Peripheral vascular disease, unspecified: Secondary | ICD-10-CM | POA: Insufficient documentation

## 2013-04-08 HISTORY — DX: Peripheral vascular disease, unspecified: I73.9

## 2013-04-08 HISTORY — DX: Malignant neoplasm of cervix uteri, unspecified: C53.9

## 2013-04-08 HISTORY — DX: Personal history of other medical treatment: Z92.89

## 2013-04-08 HISTORY — DX: Type 2 diabetes mellitus without complications: E11.9

## 2013-04-08 HISTORY — PX: ILIAC ARTERY STENT: SHX1786

## 2013-04-08 HISTORY — DX: Orthopnea: R06.01

## 2013-04-08 HISTORY — PX: LOWER EXTREMITY ANGIOGRAM: SHX5508

## 2013-04-08 LAB — POCT ACTIVATED CLOTTING TIME
Activated Clotting Time: 188 seconds
Activated Clotting Time: 215 seconds
Activated Clotting Time: 243 seconds
Activated Clotting Time: 138 seconds

## 2013-04-08 LAB — GLUCOSE, CAPILLARY
Glucose-Capillary: 182 mg/dL — ABNORMAL HIGH (ref 70–99)
Glucose-Capillary: 109 mg/dL — ABNORMAL HIGH (ref 70–99)
Glucose-Capillary: 142 mg/dL — ABNORMAL HIGH (ref 70–99)
Glucose-Capillary: 151 mg/dL — ABNORMAL HIGH (ref 70–99)

## 2013-04-08 SURGERY — ANGIOGRAM, LOWER EXTREMITY
Anesthesia: LOCAL

## 2013-04-08 MED ORDER — HYDRALAZINE HCL 20 MG/ML IJ SOLN: 10.0000 mg | Freq: Once | INTRAMUSCULAR | Status: DC

## 2013-04-08 MED ORDER — GLIPIZIDE ER 5 MG PO TB24
5.0000 mg | ORAL_TABLET | Freq: Every day | ORAL | Status: DC
  Administered 2013-04-09: 5 mg via ORAL
  Filled 2013-04-08 (×2): qty 1

## 2013-04-08 MED ORDER — ASPIRIN 81 MG PO CHEW
CHEWABLE_TABLET | ORAL | Status: AC
  Filled 2013-04-08: qty 1

## 2013-04-08 MED ORDER — MORPHINE SULFATE 2 MG/ML IJ SOLN
2.0000 mg | INTRAMUSCULAR | Status: DC | PRN
  Administered 2013-04-08 (×4): 2 mg via INTRAVENOUS
  Filled 2013-04-08 (×4): qty 1

## 2013-04-08 MED ORDER — HYDROCHLOROTHIAZIDE 12.5 MG PO CAPS
12.5000 mg | ORAL_CAPSULE | Freq: Two times a day (BID) | ORAL | Status: DC
  Administered 2013-04-08 – 2013-04-09 (×2): 12.5 mg via ORAL
  Filled 2013-04-08 (×4): qty 1

## 2013-04-08 MED ORDER — CLOPIDOGREL BISULFATE 75 MG PO TABS
75.0000 mg | ORAL_TABLET | Freq: Every day | ORAL | Status: DC
  Administered 2013-04-09: 09:00:00 75 mg via ORAL
  Filled 2013-04-08: qty 1

## 2013-04-08 MED ORDER — FENTANYL CITRATE 0.05 MG/ML IJ SOLN
INTRAMUSCULAR | Status: AC
  Filled 2013-04-08: qty 2

## 2013-04-08 MED ORDER — SODIUM CHLORIDE 0.9 % IJ SOLN: 3.0000 mL | INTRAMUSCULAR | Status: DC | PRN

## 2013-04-08 MED ORDER — HEPARIN SODIUM (PORCINE) 1000 UNIT/ML IJ SOLN
INTRAMUSCULAR | Status: AC
  Filled 2013-04-08: qty 1

## 2013-04-08 MED ORDER — LIDOCAINE HCL (PF) 1 % IJ SOLN
INTRAMUSCULAR | Status: AC
  Filled 2013-04-08: qty 30

## 2013-04-08 MED ORDER — CLOPIDOGREL BISULFATE 300 MG PO TABS
ORAL_TABLET | ORAL | Status: AC
  Filled 2013-04-08: qty 1

## 2013-04-08 MED ORDER — LISINOPRIL-HYDROCHLOROTHIAZIDE 20-12.5 MG PO TABS: 1.0000 | ORAL_TABLET | Freq: Two times a day (BID) | ORAL | Status: DC

## 2013-04-08 MED ORDER — ASPIRIN 81 MG PO CHEW
81.0000 mg | CHEWABLE_TABLET | ORAL | Status: AC
  Administered 2013-04-08: 81 mg via ORAL

## 2013-04-08 MED ORDER — GABAPENTIN 300 MG PO CAPS
300.0000 mg | ORAL_CAPSULE | Freq: Three times a day (TID) | ORAL | Status: DC
  Administered 2013-04-08 – 2013-04-09 (×4): 300 mg via ORAL
  Filled 2013-04-08 (×6): qty 1

## 2013-04-08 MED ORDER — ACETAMINOPHEN 325 MG PO TABS: 650.0000 mg | ORAL_TABLET | ORAL | Status: DC | PRN

## 2013-04-08 MED ORDER — ALBUTEROL SULFATE HFA 108 (90 BASE) MCG/ACT IN AERS
1.0000 | INHALATION_SPRAY | Freq: Four times a day (QID) | RESPIRATORY_TRACT | Status: DC | PRN
  Filled 2013-04-08 (×3): qty 6.7

## 2013-04-08 MED ORDER — DIAZEPAM 5 MG PO TABS
5.0000 mg | ORAL_TABLET | ORAL | Status: AC
  Administered 2013-04-08: 5 mg via ORAL

## 2013-04-08 MED ORDER — SODIUM CHLORIDE 0.9 % IV SOLN
INTRAVENOUS | Status: DC
  Administered 2013-04-08: 1000 mL via INTRAVENOUS

## 2013-04-08 MED ORDER — SIMVASTATIN 10 MG PO TABS
10.0000 mg | ORAL_TABLET | Freq: Every day | ORAL | Status: DC
  Administered 2013-04-08: 22:00:00 10 mg via ORAL
  Filled 2013-04-08 (×2): qty 1

## 2013-04-08 MED ORDER — FAMOTIDINE IN NACL 20-0.9 MG/50ML-% IV SOLN
INTRAVENOUS | Status: AC
  Filled 2013-04-08: qty 50

## 2013-04-08 MED ORDER — ASPIRIN 81 MG PO CHEW
81.0000 mg | CHEWABLE_TABLET | Freq: Every day | ORAL | Status: DC
  Administered 2013-04-09: 11:00:00 81 mg via ORAL
  Filled 2013-04-08: qty 1

## 2013-04-08 MED ORDER — ONDANSETRON HCL 4 MG/2ML IJ SOLN
4.0000 mg | Freq: Four times a day (QID) | INTRAMUSCULAR | Status: DC | PRN
  Administered 2013-04-08: 4 mg via INTRAVENOUS
  Filled 2013-04-08: qty 2

## 2013-04-08 MED ORDER — DIAZEPAM 5 MG PO TABS
ORAL_TABLET | ORAL | Status: AC
  Filled 2013-04-08: qty 1

## 2013-04-08 MED ORDER — LISINOPRIL 20 MG PO TABS
20.0000 mg | ORAL_TABLET | Freq: Two times a day (BID) | ORAL | Status: DC
  Administered 2013-04-08 – 2013-04-09 (×3): 20 mg via ORAL
  Filled 2013-04-08 (×4): qty 1

## 2013-04-08 MED ORDER — MIDAZOLAM HCL 2 MG/2ML IJ SOLN
INTRAMUSCULAR | Status: AC
  Filled 2013-04-08: qty 2

## 2013-04-08 MED ORDER — AMITRIPTYLINE HCL 50 MG PO TABS
50.0000 mg | ORAL_TABLET | Freq: Every day | ORAL | Status: DC
  Administered 2013-04-08: 50 mg via ORAL
  Filled 2013-04-08 (×2): qty 1

## 2013-04-08 MED ORDER — SODIUM CHLORIDE 0.9 % IV SOLN
INTRAVENOUS | Status: AC
  Administered 2013-04-08: 11:00:00 via INTRAVENOUS

## 2013-04-08 MED ORDER — HYDRALAZINE HCL 20 MG/ML IJ SOLN
INTRAMUSCULAR | Status: AC
  Administered 2013-04-08: 13:00:00 10 mg
  Filled 2013-04-08: qty 1

## 2013-04-08 MED ORDER — HEPARIN (PORCINE) IN NACL 2-0.9 UNIT/ML-% IJ SOLN
INTRAMUSCULAR | Status: AC
  Filled 2013-04-08: qty 1000

## 2013-04-08 NOTE — CV Procedure (Signed)
Jackie Evans is a 57 y.o. female    [id number] LOCATION:  FACILITY: [facility]  PHYSICIAN: Jackie Evans, M.D. 04-10-1956   DATE OF PROCEDURE:  04/08/2013  DATE OF DISCHARGE:     PV Angiogram/Intervention    History obtained from chart review.[name] is a 57 year old moderately overweight divorced Caucasian female mother of 4, grandmother to 12 grandchildren who is referred by Dr. Charlsie Evans for peripheral vascular evaluation because of critical limb ischemia. Her primary care physician is Dr. Reginold Evans. Her risk factors include tobacco abuse of 40-80 pack years with COPD. She does continue to smoke one half pack per day. She is treated diabetes, hypertension and hyperlipidemia. She's never had a heart attack or stroke. She denies chest pain but is short of breath. She's had left lower extremity discomfort for one to 2 months but the rest and with exertion. Her left ABI is 0.66.    PROCEDURE DESCRIPTION:   The patient was brought to the second floor [place] [facility] cath lab in the postabsorptive state. She was premedicated with Valium 5 mg by mouth, IV Versed and fentanyl. Her right and left groins Were prepped and shaved in usual sterile fashion. Xylocaine 1% was used for local anesthesia. A 5 French sheath was inserted into the right common femoral artery using standard Seldinger technique.a 5 French pigtail catheter was used for distal abdominal aortography, bilateral iliac angiography, and bifemoral runoff. Visipaque was used for the entirety of the case. Retrograde aortic pressure was monitored during the case.  HEMODYNAMICS:    AO SYSTOLIC/AO DIASTOLIC: 154/73   Angiographic Data:   1: Abdominal aortogram-widely patent distal abdominal aorta  2: Left lower extremity-70% segmental left common iliac artery, occluded left external iliac artery with reconstitution of the common femoral level with three-vessel runoff  3: Right lower extremity-widely patent with three-vessel  runoff  IMPRESSION: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 We will proceed with attempt at percutaneous revascularization using both antegrade and retrograde techniques.  Procedure Description:contralateral access was obtained with a 5 French angled catheter, O35 Versicore  wire and a 6 [place] destination sheath. The patient received a total of 10,000 units of heparin intravenously with an ACT of 243. Total contrast administered the patient was 400 cc. The contralateral sheath was placed in the left external iliac artery. The chronic total occlusion was crossed with a Viance CTO catheter along with an 014/300 cm long [place] core wire. The wire was demonstrated intravesically to be intraluminal and was placed in the profunda femoris. Following this balloon dilatation was performed with a 4 mm x 80 mm balloon upgrading to a 5 mm balloon. Following this a stenting was performed with a 8 mm x 60 mm long at that absolute pro Nitinol self-expanding stent. This was then dilated with a 6 mm x 4 cm balloon. Following this ipsilateral access was obtained with a Seldinger needle and Versicore  Wire. a 6 [place] Bright tip sheath was then advanced over the wire and placed in the left external iliac artery. Following this a 9 mm x 60 mm long Cordis Smart nitinol cytostatic stent was then carefully positioned and deployed in the left common iliac artery and postdilated with a 7 mm x 4 cm balloon throughout its entirety. Following this a 6 mm x 29 mm Omnilink balloon expandable stent was then placed in the mid left external iliac artery within the  previously placed diagonal stent because of a demonstrated 20-30 mm pullback gradient. Completion angiography was performed with a pigtail catheter. The long 6 French sheaths were then exchanged over wire for a short 6 [place] sheaths. There  was bruising around the right common femoral sheath and this was then upsized to a 7 sheath with excellent hemostasis. The patient received 300 mg of Plavix by mouth.  Final Impression: successful percutaneous revascularization of a left external iliac artery chronic total occlusion using a Viance  CTO catheter, Nitinol self expanding stent, balloon expandable stent in the external iliac artery as well as a self-expanding stent in the common iliac artery for lifestyle limiting claudication. The patient tolerated the procedure well. The sheath will be removed once the ACT Focalin 70 and pressure will be held. The patient will be chewed aspirin and Plavix. She will be hydrated overnight, discharged home in the morning and get followup arterial Doppler studies after which I will see her back.    Runell Gess MD, [facility] 04/08/2013 10:11 AM

## 2013-04-08 NOTE — H&P (View-Only) (Signed)
03/27/2013 Jackie Evans   09-Jun-1955  [id number]  Primary Physician Dorrene German, MD Primary Cardiologist: Runell Gess MD Jackie Evans   HPI:  [name] is a 57 year old moderately overweight divorced Caucasian female mother of 4, grandmother to 20 grandchildren who is referred by Dr. Charlsie Merles for peripheral vascular evaluation because of critical limb ischemia. Her primary care physician is Dr. Reginold Agent. Her risk factors include tobacco abuse of 40-80 pack years with COPD. She does continue to smoke one half pack per day. She is treated diabetes, hypertension and hyperlipidemia. She's never had a heart attack or stroke. She denies chest pain but is short of breath. She's had left lower extremity discomfort for one to 2 months but the rest and with exertion. Her left ABI is 0.66.   Current Outpatient Prescriptions  Medication Sig Dispense Refill  . Albuterol Sulfate (PROAIR HFA IN) Inhale into the lungs.      . AMITRIPTYLINE HCL PO Take by mouth.      . gabapentin (NEURONTIN) 300 MG capsule Take 300 mg by mouth 3 (three) times daily.      [name] glipiZIDE (GLUCOTROL XL) 5 MG 24 hr tablet Take 5 mg by mouth daily with breakfast.      . lisinopril-hydrochlorothiazide (PRINZIDE,ZESTORETIC) 20-12.5 MG per tablet       . metFORMIN (GLUCOPHAGE) 1000 MG tablet Take 500-1,000 mg by mouth 3 (three) times daily. Patient takes 1 tablet in the morning, 1/2 tablet in the evening and 1 tablet at bedtime      . simvastatin (ZOCOR) 10 MG tablet Take 10 mg by mouth daily.       No current facility-administered medications for this visit.    No Known Allergies  History   Social History  . Marital Status: Single    Spouse Name: N/A    Number of Children: N/A  . Years of Education: N/A   Occupational History  . Not on file.   Social History Main Topics  . Smoking status: Current Every Day Smoker -- 0.50 packs/day  . Smokeless tobacco: Not on file  . Alcohol Use: No  . Drug  Use: No  . Sexual Activity:    Other Topics Concern  . Not on file   Social History Narrative  . No narrative on file     Review of Systems: General: negative for chills, fever, night sweats or weight changes.  Cardiovascular: negative for chest pain, dyspnea on exertion, edema, orthopnea, palpitations, paroxysmal nocturnal dyspnea or shortness of breath Dermatological: negative for rash Respiratory: negative for cough or wheezing Urologic: negative for hematuria Abdominal: negative for nausea, vomiting, diarrhea, bright red blood per rectum, melena, or hematemesis Neurologic: negative for visual changes, syncope, or dizziness All other systems reviewed and are otherwise negative except as noted above.    Blood pressure 148/62, pulse 96, height 5\' 8"  (1.727 m), weight 241 lb (109.317 kg).  General appearance: alert and no distress Neck: no adenopathy, no carotid bruit, no JVD, supple, symmetrical, trachea midline and thyroid not enlarged, symmetric, no tenderness/mass/nodules Lungs: clear to auscultation bilaterally Heart: regular rate and rhythm, S1, S2 normal, no murmur, click, rub or gallop Extremities: 2+ right and absent left pedal pulse. There was dependent rubor on the left. Pulses: 2+ right, absent left.  EKG not performed today  ASSESSMENT AND PLAN:   COPD (chronic obstructive pulmonary disease) 40-80 pack years currently smoking one half pack per day. Oxygen dependent.  Hyperlipidemia On statin therapy followed by  her PCP  Essential hypertension Controlled on current medications  Critical lower limb ischemia 1-2 month history of left lower extremity pain both at rest and with ambulation. She has dependent rubor. She is was referred by Dr. Cristie Hem , from [facility], for further peripheral vascular evaluation. Lower extremity arterial Dopplers performed today revealed a right ABI of 0.90 L. 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 She has no palpable left femoral pulse.      Runell Gess MD FACP,FACC,FAHA, [facility] 03/27/2013 3:12 PM

## 2013-04-08 NOTE — Progress Notes (Signed)
Site area: right groin  Site Prior to Removal:  Level 0  Pressure Applied For 20 MINUTES    Minutes Beginning at 1600  Manual:   yes  Patient Status During Pull:  stable  Post Pull Groin Site:  Level 0  Post Pull Instructions Given:  yes  Post Pull Pulses Present:  yes  Dressing Applied:  yes  Comments:

## 2013-04-08 NOTE — Progress Notes (Signed)
Site area: left groin  Site Prior to Removal:  Level 0  Pressure Applied For 20 MINUTES    Minutes Beginning at 1500  Manual:   yes  Patient Status During Pull:  stable  Post Pull Groin Site:  Level 0  Post Pull Instructions Given:  yes  Post Pull Pulses Present:  yes  Dressing Applied:  yes  Comments:

## 2013-04-08 NOTE — Interval H&P Note (Signed)
History and Physical Interval Note:  04/08/2013 7:52 AM  Jackie Evans  has presented today for surgery, with the diagnosis of Claudication  The various methods of treatment have been discussed with the patient and family. After consideration of risks, benefits and other options for treatment, the patient has consented to  Procedure(s): LOWER EXTREMITY ANGIOGRAM (N/A) as a surgical intervention .  The patient's history has been reviewed, patient examined, no change in status, stable for surgery.  I have reviewed the patient's chart and labs.  Questions were answered to the patient's satisfaction.     Runell Gess

## 2013-04-09 ENCOUNTER — Other Ambulatory Visit: Payer: Self-pay | Admitting: Physician Assistant

## 2013-04-09 ENCOUNTER — Encounter: Payer: Self-pay | Admitting: *Deleted

## 2013-04-09 DIAGNOSIS — J4489 Other specified chronic obstructive pulmonary disease: Secondary | ICD-10-CM

## 2013-04-09 DIAGNOSIS — I10 Essential (primary) hypertension: Secondary | ICD-10-CM

## 2013-04-09 DIAGNOSIS — Z9889 Other specified postprocedural states: Secondary | ICD-10-CM

## 2013-04-09 DIAGNOSIS — J449 Chronic obstructive pulmonary disease, unspecified: Secondary | ICD-10-CM

## 2013-04-09 DIAGNOSIS — I739 Peripheral vascular disease, unspecified: Secondary | ICD-10-CM

## 2013-04-09 DIAGNOSIS — I999 Unspecified disorder of circulatory system: Secondary | ICD-10-CM

## 2013-04-09 DIAGNOSIS — Z01818 Encounter for other preprocedural examination: Secondary | ICD-10-CM

## 2013-04-09 LAB — CBC
HCT: 39.8 % (ref 36.0–46.0)
MCH: 28.8 pg (ref 26.0–34.0)
MCHC: 32.2 g/dL (ref 30.0–36.0)
MCV: 89.6 fL (ref 78.0–100.0)
Platelets: 155 10*3/uL (ref 150–400)
RBC: 4.44 MIL/uL (ref 3.87–5.11)
WBC: 8.2 10*3/uL (ref 4.0–10.5)

## 2013-04-09 LAB — BASIC METABOLIC PANEL
BUN: 15 mg/dL (ref 6–23)
CO2: 31 mEq/L (ref 19–32)
Calcium: 8.9 mg/dL (ref 8.4–10.5)
Chloride: 99 mEq/L (ref 96–112)
Creatinine, Ser: 1.09 mg/dL (ref 0.50–1.10)
Sodium: 139 mEq/L (ref 135–145)

## 2013-04-09 MED ORDER — METFORMIN HCL 1000 MG PO TABS: 500.0000 mg | ORAL_TABLET | Freq: Three times a day (TID) | ORAL | Status: AC

## 2013-04-09 MED ORDER — ASPIRIN 81 MG PO CHEW: 81.0000 mg | CHEWABLE_TABLET | Freq: Every day | ORAL | Status: AC

## 2013-04-09 MED ORDER — CLOPIDOGREL BISULFATE 75 MG PO TABS: 75.0000 mg | ORAL_TABLET | Freq: Every day | ORAL | Status: DC

## 2013-04-09 NOTE — Progress Notes (Signed)
Patient ambulated in hall with oxygen and tolerated well. Heart rate increased to 100's and shortly to 140's then resolved. Patient reported that at home she ambulates short distances in the home to do things and spaces out activities in order to tolerate them. This is her baseline. Reported to PA. Resting after ambulation and heart rate returned to 80's with rest.

## 2013-04-09 NOTE — Progress Notes (Signed)
Subjective: No complaints  Objective: Vital signs in last 24 hours: Temp:  [97.9 F (36.6 C)-98.6 F (37 C)] 97.9 F (36.6 C) (12/17 0539) Pulse Rate:  [68-100] 100 (12/17 0539) Resp:  [12-18] 15 (12/17 0539) BP: (111-166)/(46-114) 135/57 mmHg (12/17 0539) SpO2:  [88 %-93 %] 91 % (12/17 0539) Weight:  [243 lb 13.3 oz (110.6 kg)] 243 lb 13.3 oz (110.6 kg) (12/17 0038) Last BM Date: 04/08/13  Intake/Output from previous day: 12/16 0701 - 12/17 0700 In: 1578.3 [P.O.:720; I.V.:858.3] Out: 600 [Urine:600] Intake/Output this shift:    Medications Current Facility-Administered Medications  Medication Dose Route Frequency Provider Last Rate Last Dose  . acetaminophen (TYLENOL) tablet 650 mg  650 mg Oral Q4H PRN Runell Gess, MD      . albuterol (PROVENTIL HFA;VENTOLIN HFA) 108 (90 BASE) MCG/ACT inhaler 1 puff  1 puff Inhalation Q6H PRN Runell Gess, MD      . amitriptyline (ELAVIL) tablet 50 mg  50 mg Oral QHS Runell Gess, MD   50 mg at 04/08/13 2139  . aspirin chewable tablet 81 mg  81 mg Oral Daily Runell Gess, MD      . clopidogrel (PLAVIX) tablet 75 mg  75 mg Oral Q breakfast Runell Gess, MD      . gabapentin (NEURONTIN) capsule 300 mg  300 mg Oral TID Runell Gess, MD   300 mg at 04/08/13 2139  . glipiZIDE (GLUCOTROL XL) 24 hr tablet 5 mg  5 mg Oral Q breakfast Runell Gess, MD      . hydrALAZINE (APRESOLINE) injection 10 mg  10 mg Intravenous Once Nada Boozer, NP      . hydrochlorothiazide (MICROZIDE) capsule 12.5 mg  12.5 mg Oral BID Runell Gess, MD   12.5 mg at 04/08/13 1301  . lisinopril (PRINIVIL,ZESTRIL) tablet 20 mg  20 mg Oral BID Runell Gess, MD   20 mg at 04/08/13 2139  . morphine 2 MG/ML injection 2 mg  2 mg Intravenous Q1H PRN Runell Gess, MD   2 mg at 04/08/13 1812  . ondansetron (ZOFRAN) injection 4 mg  4 mg Intravenous Q6H PRN Runell Gess, MD   4 mg at 04/08/13 1810  . simvastatin (ZOCOR) tablet 10 mg  10 mg  Oral Daily Runell Gess, MD   10 mg at 04/08/13 2139    PE: General appearance: alert, cooperative and no distress Lungs: clear to auscultation bilaterally Heart: regular rate and rhythm and 1/6 sys MM Extremities: 1+ LEE Pulses: 2+ radials.  1+ right DP, 0 left pedal pulses.  Feet are warm. Skin: Warm and dry.  Both groins:  Nontender. No hematomas Neurologic: Grossly normal  Lab Results:   Recent Labs  04/09/13 0610  WBC 8.2  HGB 12.8  HCT 39.8  PLT 155   BMET  Recent Labs  04/09/13 0610  NA 139  K 4.0  CL 99  CO2 31  GLUCOSE 155*  BUN 15  CREATININE 1.09  CALCIUM 8.9    Assessment/Plan   Principal Problem:   Claudication Active Problems:   Essential hypertension   Hyperlipidemia   Diabetes   COPD (chronic obstructive pulmonary disease)   Critical lower limb ischemia   PAD (peripheral artery disease)   S/P arterial stent, Lt Ext iliac nitinol self exp. stent and in common iliac 04/08/14  Plan:  SP: successful percutaneous revascularization of a left external iliac artery chronic total occlusion using a Viance  CTO catheter, Nitinol self expanding stent, balloon expandable stent in the external iliac artery as well as a self-expanding stent in the common iliac artery.  ASA plavix.  OP LEA dopplers.      LOS: 1 day    HAGER, BRYAN 04/09/2013 8:49 AM   Agree with note written by Jones Skene PAC  S/P RCIA/EIA PTA and stenting of CTO for claudication. Looks great!!!. Exam benign. Groins OK. Palpable LPP. Labs OK. D/C home on DAPT. LEA then ROV with me.   Runell Gess 04/09/2013 9:15 AM

## 2013-04-10 NOTE — Discharge Summary (Signed)
Physician Discharge Summary  Patient ID: Jackie Evans MRN: [id number] DOB/AGE: [birth year] 57 y.o.  Admit date: 04/08/2013 Discharge date: 04/10/2013  Admission Diagnoses:  Claudication  Discharge Diagnoses:  Principal Problem:   Claudication Active Problems:   Essential hypertension   Hyperlipidemia   Diabetes   COPD (chronic obstructive pulmonary disease)   Critical lower limb ischemia   PAD (peripheral artery disease)   S/P arterial stent, Lt Ext iliac nitinol self exp. stent and in common iliac 04/08/14   Discharged Condition: stable  Hospital Course: Jackie Evans is a 57 year old moderately overweight divorced Caucasian female mother of 4, grandmother to 79 grandchildren who is referred by Dr. Charlsie Merles for peripheral vascular evaluation because of critical limb ischemia. Her primary care physician is Dr. Reginold Agent. Her risk factors include tobacco abuse of 40-80 pack years with COPD. She does continue to smoke one half pack per day. She is treated diabetes, hypertension and hyperlipidemia. She's never had a heart attack or stroke. She denies chest pain but is short of breath. She's had left lower extremity discomfort for one to 2 months but the rest and with exertion. Her left ABI is 0.66.  She presented to [facility] for PV angiogram and underwent successful percutaneous revascularization of a left external iliac artery chronic total occlusion using a Viance CTO catheter, Nitinol self expanding stent, balloon expandable stent in the external iliac artery as well as a self-expanding stent in the common iliac artery. ASA plavix. OP LEA dopplers were ordered. The patient was seen by Dr. Allyson Sabal who felt she was stable for DC home.     Consults: None  Significant Diagnostic Studies:  PV angiogram. PROCEDURE DESCRIPTION:  The patient was brought to the second floor [place] [facility] cath lab in the postabsorptive state. She was premedicated with Valium 5 mg by mouth, IV  Versed and fentanyl. Her right and left groins Were prepped and shaved in usual sterile fashion. Xylocaine 1% was used for local anesthesia. A 5 French sheath was inserted into the right common femoral artery using standard Seldinger technique.a 5 French pigtail catheter was used for distal abdominal aortography, bilateral iliac angiography, and bifemoral runoff. Visipaque was used for the entirety of the case. Retrograde aortic pressure was monitored during the case.  HEMODYNAMICS:  AO SYSTOLIC/AO DIASTOLIC: 154/73  Angiographic Data:  1: Abdominal aortogram-widely patent distal abdominal aorta  2: Left lower extremity-70% segmental left common iliac artery, occluded left external iliac artery with reconstitution of the common femoral level with three-vessel runoff  3: Right lower extremity-widely patent with three-vessel runoff  IMPRESSION: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 We will proceed with attempt at percutaneous revascularization using both antegrade and retrograde techniques.  Procedure Description:contralateral access was obtained with a 5 French angled catheter, O35 Versicore wire and a 6 [place] destination sheath. The patient received a total of 10,000 units of heparin intravenously with an ACT of 243. Total contrast administered the patient was 400 cc. The contralateral sheath was placed in the left external iliac artery. The chronic total occlusion was crossed with a Viance CTO catheter along with an 014/300 cm long [place] core wire. The wire was demonstrated intravesically to be intraluminal and was placed in the profunda femoris. Following this balloon dilatation was performed with a 4 mm x 80 mm balloon upgrading to a 5 mm balloon. Following this a stenting was performed  with a 8 mm x 60 mm long at that absolute pro Nitinol self-expanding  stent. This was then dilated with a 6 mm x 4 cm balloon. Following this ipsilateral access was obtained with a Seldinger needle and Versicore Wire. a 6 [place] Bright tip sheath was then advanced over the wire and placed in the left external iliac artery. Following this a 9 mm x 60 mm long Cordis Smart nitinol cytostatic stent was then carefully positioned and deployed in the left common iliac artery and postdilated with a 7 mm x 4 cm balloon throughout its entirety. Following this a 6 mm x 29 mm Omnilink balloon expandable stent was then placed in the mid left external iliac artery within the previously placed diagonal stent because of a demonstrated 20-30 mm pullback gradient. Completion angiography was performed with a pigtail catheter. The long 6 French sheaths were then exchanged over wire for a short 6 [place] sheaths. There was bruising around the right common femoral sheath and this was then upsized to a 7 sheath with excellent hemostasis. The patient received 300 mg of Plavix by mouth.   Final Impression: successful percutaneous revascularization of a left external iliac artery chronic total occlusion using a Viance CTO catheter, Nitinol self expanding stent, balloon expandable stent in the external iliac artery as well as a self-expanding stent in the common iliac artery for lifestyle limiting claudication. The patient tolerated the procedure well. The sheath will be removed once the ACT Focalin 70 and pressure will be held. The patient will be chewed aspirin and Plavix. She will be hydrated overnight, discharged home in the morning and get followup arterial Doppler studies after which I will see her back.  Runell Gess MD, [facility]  04/08/2013  Treatments: See above  Discharge Exam: Blood pressure 136/50, pulse 96, temperature 97.7 F (36.5 C), temperature source Oral, resp. rate 18, height 5\' 8"  (1.727 m), weight 243 lb 13.3 oz (110.6 kg), last menstrual period 04/25/1999, SpO2  86.00%.   Disposition: 01-Home or Self Care  Discharge Orders   Future Orders Complete By Expires   Diet - low sodium heart healthy  As directed    Discharge instructions  As directed    Comments:     No lifting more than a half gallon of milk or driving for three days.   Increase activity slowly  As directed        Medication List         amitriptyline 50 MG tablet  Commonly known as:  ELAVIL  Take 50 mg by mouth at bedtime.     aspirin 81 MG chewable tablet  Chew 1 tablet (81 mg total) by mouth daily.     clopidogrel 75 MG tablet  Commonly known as:  PLAVIX  Take 1 tablet (75 mg total) by mouth daily with breakfast.     gabapentin 300 MG capsule  Commonly known as:  NEURONTIN  Take 300 mg by mouth 3 (three) times daily.     glipiZIDE 5 MG 24 hr tablet  Commonly known as:  GLUCOTROL XL  Take 5 mg by mouth daily with breakfast.     lisinopril-hydrochlorothiazide 20-12.5 MG per tablet  Commonly known as:  PRINZIDE,ZESTORETIC  Take 1 tablet by mouth 2 (two) times daily.     metFORMIN 1000 MG tablet  Commonly known as:  GLUCOPHAGE  Take 0.5-1 tablets (500-1,000 mg total) by mouth 3 (three) times daily. Patient takes 1 tablet in the morning, 1/2 tablet in the evening and  1 tablet at bedtime     PROAIR HFA 108 (90 BASE) MCG/ACT inhaler  Generic drug:  albuterol  Inhale 1 puff into the lungs every 6 (six) hours as needed for wheezing or shortness of breath.     simvastatin 10 MG tablet  Commonly known as:  ZOCOR  Take 10 mg by mouth daily.           Follow-up Information   Follow up with Runell Gess, MD.   Specialty:  Cardiology   Contact information:   [address] [place] [address] [phone]       Signed: Wilburt Finlay 04/10/2013, 10:33 AM

## 2013-04-14 ENCOUNTER — Telehealth (HOSPITAL_COMMUNITY): Payer: Self-pay | Admitting: *Deleted

## 2013-04-14 ENCOUNTER — Encounter (HOSPITAL_COMMUNITY): Payer: Self-pay | Admitting: *Deleted

## 2013-04-28 ENCOUNTER — Ambulatory Visit (HOSPITAL_COMMUNITY)
Admission: RE | Admit: 2013-04-28 | Discharge: 2013-04-28 | Disposition: A | Discharge status: Home / Self Care | Payer: Medicare Other | Source: Ambulatory Visit | Attending: Physician Assistant | Admitting: Physician Assistant

## 2013-04-28 DIAGNOSIS — Z9889 Other specified postprocedural states: Secondary | ICD-10-CM | POA: Insufficient documentation

## 2013-04-28 DIAGNOSIS — I739 Peripheral vascular disease, unspecified: Secondary | ICD-10-CM

## 2013-04-28 NOTE — Progress Notes (Signed)
Left Lower Ext. Arterial Duplex completed post stent placement.  Jackie Evans, BS, RDMS, RVT

## 2013-05-09 ENCOUNTER — Encounter: Payer: Self-pay | Admitting: Cardiovascular Disease

## 2013-05-09 ENCOUNTER — Ambulatory Visit (INDEPENDENT_AMBULATORY_CARE_PROVIDER_SITE_OTHER): Payer: Medicare Other | Admitting: Cardiovascular Disease

## 2013-05-09 VITALS — BP 126/70 | HR 84 | Ht 68.0 in | Wt 239.0 lb

## 2013-05-09 DIAGNOSIS — F17200 Nicotine dependence, unspecified, uncomplicated: Secondary | ICD-10-CM

## 2013-05-09 DIAGNOSIS — Z720 Tobacco use: Secondary | ICD-10-CM

## 2013-05-09 DIAGNOSIS — I999 Unspecified disorder of circulatory system: Secondary | ICD-10-CM

## 2013-05-09 DIAGNOSIS — E785 Hyperlipidemia, unspecified: Secondary | ICD-10-CM

## 2013-05-09 DIAGNOSIS — I998 Other disorder of circulatory system: Secondary | ICD-10-CM

## 2013-05-09 DIAGNOSIS — I739 Peripheral vascular disease, unspecified: Secondary | ICD-10-CM

## 2013-05-09 DIAGNOSIS — E119 Type 2 diabetes mellitus without complications: Secondary | ICD-10-CM

## 2013-05-09 DIAGNOSIS — I10 Essential (primary) hypertension: Secondary | ICD-10-CM

## 2013-05-09 DIAGNOSIS — I70229 Atherosclerosis of native arteries of extremities with rest pain, unspecified extremity: Secondary | ICD-10-CM

## 2013-05-09 DIAGNOSIS — J449 Chronic obstructive pulmonary disease, unspecified: Secondary | ICD-10-CM

## 2013-05-09 MED ORDER — NICOTINE 14 MG/24HR TD PT24: 14.0000 mg | MEDICATED_PATCH | Freq: Every day | TRANSDERMAL | Status: AC

## 2013-05-09 MED ORDER — NICOTINE 7 MG/24HR TD PT24: 7.0000 mg | MEDICATED_PATCH | Freq: Every day | TRANSDERMAL | Status: AC

## 2013-05-09 NOTE — Assessment & Plan Note (Signed)
On statin therapy followed by her PCP 

## 2013-05-09 NOTE — Progress Notes (Signed)
05/09/2013 Jackie Evans   11-02-1955  [id number]  Primary Physician Philis Fendt, MD Primary Cardiologist: Lorretta Harp MD Renae Gloss   HPI:  Jackie Evans is a 58 year old moderately overweight divorced Caucasian female mother of 32, grandmother to 81 grandchildren who is referred by Dr. Paulla Dolly for peripheral vascular evaluation because of critical limb ischemia. Her primary care physician is Dr. Sofie Hartigan. Her risk factors include tobacco abuse of 40-80 pack years with COPD. She does continue to smoke one half pack per day. She is treated diabetes, hypertension and hyperlipidemia. She's never had a heart attack or stroke. She denies chest pain but is short of breath. She's had left lower extremity discomfort for one to 2 months but at rest and with exertion. Her left ABI  Was .66 on the left. 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 A recanalized chronic total occlusion with antegrade and retrograde and placed to self expanding stent. Her ABI subsequently improved to 0.83 on the left with resolution of her claudication symptoms.    Current Outpatient Prescriptions  Medication Sig Dispense Refill  . albuterol (PROAIR HFA) 108 (90 BASE) MCG/ACT inhaler Inhale 1 puff into the lungs every 6 (six) hours as needed for wheezing or shortness of breath.      [name] amitriptyline (ELAVIL) 50 MG tablet Take 50 mg by mouth at bedtime.      [name] aspirin 81 MG chewable tablet Chew 1 tablet (81 mg total) by mouth daily.      . clopidogrel (PLAVIX) 75 MG tablet Take 1 tablet (75 mg total) by mouth daily with breakfast.  30 tablet  11  . gabapentin (NEURONTIN) 300 MG capsule Take 300 mg by mouth 3 (three) times daily.      [name] glipiZIDE (GLUCOTROL XL) 5 MG 24 hr tablet Take 5 mg by mouth daily with breakfast.      . lisinopril-hydrochlorothiazide (PRINZIDE,ZESTORETIC)  20-12.5 MG per tablet Take 1 tablet by mouth 2 (two) times daily.       . metFORMIN (GLUCOPHAGE) 1000 MG tablet Take 0.5-1 tablets (500-1,000 mg total) by mouth 3 (three) times daily. Patient takes 1 tablet in the morning, 1/2 tablet in the evening and 1 tablet at bedtime      . simvastatin (ZOCOR) 10 MG tablet Take 10 mg by mouth daily.       No current facility-administered medications for this visit.    No Known Allergies  History   Social History  . Marital Status: Single    Spouse Name: N/A    Number of Children: N/A  . Years of Education: N/A   Occupational History  . Not on file.   Social History Main Topics  . Smoking status: Current Every Day Smoker -- 0.50 packs/day for 40 years  . Smokeless tobacco: Never Used  . Alcohol Use: No  . Drug Use: No  . Sexual Activity: Not Currently   Other Topics Concern  . Not on file   Social History Narrative  . No narrative on file     Review of Systems: General: negative for chills, fever, night sweats or weight changes.  Cardiovascular: negative for chest pain, dyspnea on exertion, edema, orthopnea, palpitations, paroxysmal nocturnal dyspnea or shortness of breath Dermatological: negative for rash Respiratory: negative for cough or wheezing Urologic: negative for hematuria Abdominal: negative for nausea, vomiting, diarrhea, bright red blood per rectum,  melena, or hematemesis Neurologic: negative for visual changes, syncope, or dizziness All other systems reviewed and are otherwise negative except as noted above.    Blood pressure 126/70, pulse 84, height 5\' 8"  (1.727 m), weight 239 lb (108.41 kg), last menstrual period 04/25/1999.  General appearance: alert and no distress Neck: no adenopathy, no carotid bruit, no JVD, supple, symmetrical, trachea midline and thyroid not enlarged, symmetric, no tenderness/mass/nodules Lungs: clear to auscultation bilaterally Heart: regular rate and rhythm, S1, S2 normal, no murmur,  click, rub or gallop Extremities: extremities normal, atraumatic, no cyanosis or edema and 2+ left pedal pulse  EKG not performed to  ASSESSMENT AND PLAN:   PAD (peripheral artery disease) Status post angiography by myself 04/08/13 for severe lifestyle limiting claudication on the left with initial left ABI of 0.66. The abdomen revealed a long segment occlusion of the left external iliac artery and moderate stenosis of the left common iliac artery. I accessed both groins and performed percutaneous catheterization using integrated retrograde approach to self expanding stents. She had excellent angiographic and clinical result with an ABI that improved to 23. Her claudication has resolved.  COPD (chronic obstructive pulmonary disease) Ongoing tobacco abuse though she expresses a desire to stop. We discussed NicoDerm patches  Essential hypertension Controlled on current medications  Hyperlipidemia On statin therapy followed by her PCP      Lorretta Harp MD [facility], [facility] 05/09/2013 2:56 PM

## 2013-05-09 NOTE — Assessment & Plan Note (Signed)
Ongoing tobacco abuse though she expresses a desire to stop. We discussed NicoDerm patches

## 2013-05-09 NOTE — Patient Instructions (Signed)
  We will see you back in follow up in 6 months with Dr Gwenlyn Found  Dr Gwenlyn Found has ordered lower extremity dopplers in 6 months

## 2013-05-09 NOTE — Assessment & Plan Note (Signed)
Status post angiography by myself 04/08/13 for severe lifestyle limiting claudication on the left with initial left ABI of 0.66. The abdomen revealed a long segment occlusion of the left external iliac artery and moderate stenosis of the left common iliac artery. I accessed both groins and performed percutaneous catheterization using integrated retrograde approach to self expanding stents. She had excellent angiographic and clinical result with an ABI that improved to 23. Her claudication has resolved.

## 2013-05-09 NOTE — Assessment & Plan Note (Signed)
Controlled on current medications 

## 2013-10-20 ENCOUNTER — Other Ambulatory Visit (HOSPITAL_COMMUNITY): Payer: Self-pay | Admitting: Internal Medicine

## 2013-10-20 DIAGNOSIS — Z139 Encounter for screening, unspecified: Secondary | ICD-10-CM

## 2013-10-20 DIAGNOSIS — Z1231 Encounter for screening mammogram for malignant neoplasm of breast: Secondary | ICD-10-CM

## 2013-10-22 ENCOUNTER — Telehealth (HOSPITAL_COMMUNITY): Payer: Self-pay | Admitting: *Deleted

## 2013-11-07 ENCOUNTER — Telehealth (HOSPITAL_COMMUNITY): Payer: Self-pay | Admitting: *Deleted

## 2013-11-11 ENCOUNTER — Ambulatory Visit (INDEPENDENT_AMBULATORY_CARE_PROVIDER_SITE_OTHER): Payer: Medicare Other | Admitting: Cardiovascular Disease

## 2013-11-11 ENCOUNTER — Encounter: Payer: Self-pay | Admitting: Cardiovascular Disease

## 2013-11-11 VITALS — BP 152/72 | HR 69 | Ht 67.0 in | Wt 240.7 lb

## 2013-11-11 DIAGNOSIS — Z79899 Other long term (current) drug therapy: Secondary | ICD-10-CM

## 2013-11-11 DIAGNOSIS — Z9889 Other specified postprocedural states: Secondary | ICD-10-CM

## 2013-11-11 DIAGNOSIS — I10 Essential (primary) hypertension: Secondary | ICD-10-CM

## 2013-11-11 DIAGNOSIS — Z959 Presence of cardiac and vascular implant and graft, unspecified: Secondary | ICD-10-CM

## 2013-11-11 DIAGNOSIS — E785 Hyperlipidemia, unspecified: Secondary | ICD-10-CM

## 2013-11-11 NOTE — Patient Instructions (Signed)
Your physician recommends that you schedule a follow-up appointment in: 1 year  Your physician recommends that you return for lab work Fasting lipids liver

## 2013-11-11 NOTE — Assessment & Plan Note (Signed)
Controlled on her medications

## 2013-11-11 NOTE — Assessment & Plan Note (Addendum)
On oxygen therapy. She continues to smoke one half pack per day and is recalcitrant to risk factor  modification

## 2013-11-11 NOTE — Assessment & Plan Note (Signed)
On statin therapy. We'll recheck a lipid and liver profile 

## 2013-11-11 NOTE — Progress Notes (Signed)
11/11/2013 Jackie Evans   07/06/1955  [id number]  Primary Physician Philis Fendt, MD Primary Cardiologist: Lorretta Harp MD Renae Gloss   HPI:  [name] is a 58 year old moderately overweight divorced Caucasian female mother of 25, grandmother to 65 grandchildren who is referred by Dr. Paulla Dolly for peripheral vascular evaluation because of critical limb ischemia. Her primary care physician is Dr. Sofie Hartigan. Her risk factors include tobacco abuse of 40-80 pack years with COPD. She does continue to smoke one half pack per day and is oxygen dependent. She is treated diabetes, hypertension and hyperlipidemia. She's never had a heart attack or stroke. She denies chest pain but is short of breath. She's had left lower extremity discomfort for one to 2 months but at rest and with exertion. Her left ABI Was .66 on the left. 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 A recanalized chronic total occlusion with antegrade and retrograde and placed to self expanding stent. Her ABI subsequently improved to 0.83 on the left with resolution of her claudication symptoms. Since I saw her 6 months ago she has been asymptomatic.    Current Outpatient Prescriptions  Medication Sig Dispense Refill  . albuterol (PROAIR HFA) 108 (90 BASE) MCG/ACT inhaler Inhale 1 puff into the lungs every 6 (six) hours as needed for wheezing or shortness of breath.      [name] aspirin 81 MG chewable tablet Chew 1 tablet (81 mg total) by mouth daily.      . clopidogrel (PLAVIX) 75 MG tablet Take 1 tablet (75 mg total) by mouth daily with breakfast.  30 tablet  11  . gabapentin (NEURONTIN) 300 MG capsule Take 300 mg by mouth 3 (three) times daily.      [name] glipiZIDE (GLUCOTROL XL) 5 MG 24 hr tablet Take 5 mg by mouth daily with breakfast.      . lisinopril-hydrochlorothiazide (PRINZIDE,ZESTORETIC)  20-12.5 MG per tablet Take 1 tablet by mouth 2 (two) times daily.       . metFORMIN (GLUCOPHAGE) 1000 MG tablet Take 0.5-1 tablets (500-1,000 mg total) by mouth 3 (three) times daily. Patient takes 1 tablet in the morning, 1/2 tablet in the evening and 1 tablet at bedtime      . nicotine (NICODERM CQ) 14 mg/24hr patch Place 1 patch (14 mg total) onto the skin daily.  28 patch  0  . nicotine (NICODERM CQ) 7 mg/24hr patch Place 1 patch (7 mg total) onto the skin daily.  28 patch  0  . simvastatin (ZOCOR) 10 MG tablet Take 10 mg by mouth daily.      . traZODone (DESYREL) 100 MG tablet Take 1 tablet by mouth at bedtime.       No current facility-administered medications for this visit.    No Known Allergies  History   Social History  . Marital Status: Single    Spouse Name: N/A    Number of Children: N/A  . Years of Education: N/A   Occupational History  . Not on file.   Social History Main Topics  . Smoking status: Current Every Day Smoker -- 0.50 packs/day for 40 years  . Smokeless tobacco: Never Used  . Alcohol Use: No  . Drug Use: No  . Sexual Activity: Not Currently   Other Topics Concern  . Not on file   Social History Narrative  . No narrative on file  Review of Systems: General: negative for chills, fever, night sweats or weight changes.  Cardiovascular: negative for chest pain, dyspnea on exertion, edema, orthopnea, palpitations, paroxysmal nocturnal dyspnea or shortness of breath Dermatological: negative for rash Respiratory: negative for cough or wheezing Urologic: negative for hematuria Abdominal: negative for nausea, vomiting, diarrhea, bright red blood per rectum, melena, or hematemesis Neurologic: negative for visual changes, syncope, or dizziness All other systems reviewed and are otherwise negative except as noted above.    Blood pressure 152/72, pulse 69, height 5\' 7"  (1.702 m), weight 240 lb 11.2 oz (109.181 kg), last menstrual period 04/25/1999.    General appearance: alert and no distress Neck: no adenopathy, no carotid bruit, no JVD, supple, symmetrical, trachea midline and thyroid not enlarged, symmetric, no tenderness/mass/nodules Lungs: clear to auscultation bilaterally Heart: regular rate and rhythm, S1, S2 normal, no murmur, click, rub or gallop Extremities: extremities normal, atraumatic, no cyanosis or edema  EKG normal sinus rhythm at 69 without ST or T wave changes  ASSESSMENT AND PLAN:   Essential hypertension Controlled on her medications  Hyperlipidemia On statin therapy. We'll recheck a lipid and liver profile  COPD (chronic obstructive pulmonary disease) On oxygen therapy. She continues to smoke one half pack per day and is recalcitrant to risk factor  modification  S/P arterial stent, Lt Ext iliac nitinol self exp. stent and in common iliac 04/08/14 History of left common and external iliac artery PTA and stenting by myself 04/08/13. Her followup Dopplers performed a muscular build a left ABI of 0.83. She denies claudication.      Lorretta Harp MD FACP,FACC,FAHA, [facility] 11/11/2013 2:28 PM

## 2013-11-11 NOTE — Assessment & Plan Note (Signed)
History of left common and external iliac artery PTA and stenting by myself 04/08/13. Her followup Dopplers performed a muscular build a left ABI of 0.83. She denies claudication.

## 2013-11-13 ENCOUNTER — Telehealth (HOSPITAL_COMMUNITY): Payer: Self-pay | Admitting: *Deleted

## 2013-11-24 ENCOUNTER — Telehealth: Payer: Self-pay | Admitting: Cardiovascular Disease

## 2013-11-24 ENCOUNTER — Telehealth (HOSPITAL_COMMUNITY): Payer: Self-pay | Admitting: *Deleted

## 2013-11-25 NOTE — Telephone Encounter (Signed)
Closed encounter °

## 2013-11-27 ENCOUNTER — Ambulatory Visit (HOSPITAL_COMMUNITY): Payer: Medicare Other

## 2013-11-28 ENCOUNTER — Telehealth: Payer: Self-pay | Admitting: Cardiovascular Disease

## 2013-11-28 NOTE — Telephone Encounter (Signed)
Closed encounter °

## 2013-12-01 ENCOUNTER — Telehealth: Payer: Self-pay | Admitting: Cardiovascular Disease

## 2013-12-01 NOTE — Telephone Encounter (Signed)
Closed encounter °

## 2013-12-04 ENCOUNTER — Ambulatory Visit (HOSPITAL_COMMUNITY)
Admission: RE | Admit: 2013-12-04 | Discharge: 2013-12-04 | Disposition: A | Discharge status: Home / Self Care | Payer: Medicare Other | Source: Ambulatory Visit | Attending: Cardiovascular Disease | Admitting: Cardiovascular Disease

## 2013-12-04 DIAGNOSIS — I739 Peripheral vascular disease, unspecified: Secondary | ICD-10-CM | POA: Diagnosis present

## 2013-12-04 NOTE — Progress Notes (Signed)
Lower Extremity Arterial Duplex Completed. °Brianna L Mazza,RVT °

## 2013-12-11 ENCOUNTER — Encounter: Payer: Self-pay | Admitting: *Deleted

## 2014-01-26 ENCOUNTER — Ambulatory Visit (HOSPITAL_COMMUNITY): Payer: Medicare Other

## 2014-01-29 ENCOUNTER — Ambulatory Visit (HOSPITAL_COMMUNITY)
Admission: RE | Admit: 2014-01-29 | Discharge: 2014-01-29 | Disposition: A | Discharge status: Home / Self Care | Payer: Medicare Other | Source: Ambulatory Visit | Attending: Internal Medicine | Admitting: Internal Medicine

## 2014-01-29 DIAGNOSIS — Z1231 Encounter for screening mammogram for malignant neoplasm of breast: Secondary | ICD-10-CM | POA: Insufficient documentation

## 2014-03-10 ENCOUNTER — Other Ambulatory Visit (HOSPITAL_COMMUNITY): Payer: Self-pay | Admitting: Internal Medicine

## 2014-03-10 ENCOUNTER — Ambulatory Visit (HOSPITAL_COMMUNITY)
Admission: RE | Admit: 2014-03-10 | Discharge: 2014-03-10 | Disposition: A | Discharge status: Home / Self Care | Payer: Medicare Other | Source: Ambulatory Visit | Attending: Internal Medicine | Admitting: Internal Medicine

## 2014-03-10 DIAGNOSIS — Z87891 Personal history of nicotine dependence: Secondary | ICD-10-CM | POA: Insufficient documentation

## 2014-03-10 DIAGNOSIS — J209 Acute bronchitis, unspecified: Secondary | ICD-10-CM

## 2014-03-10 DIAGNOSIS — I10 Essential (primary) hypertension: Secondary | ICD-10-CM | POA: Insufficient documentation

## 2014-03-10 DIAGNOSIS — J449 Chronic obstructive pulmonary disease, unspecified: Secondary | ICD-10-CM | POA: Insufficient documentation

## 2014-03-10 DIAGNOSIS — E119 Type 2 diabetes mellitus without complications: Secondary | ICD-10-CM | POA: Diagnosis not present

## 2014-03-10 DIAGNOSIS — J40 Bronchitis, not specified as acute or chronic: Secondary | ICD-10-CM | POA: Diagnosis not present

## 2014-04-02 ENCOUNTER — Encounter (HOSPITAL_COMMUNITY): Payer: Self-pay | Admitting: Cardiovascular Disease

## 2014-04-14 ENCOUNTER — Ambulatory Visit (HOSPITAL_COMMUNITY)
Admission: RE | Admit: 2014-04-14 | Discharge: 2014-04-14 | Disposition: A | Discharge status: Home / Self Care | Payer: Medicare Other | Source: Ambulatory Visit | Attending: Internal Medicine | Admitting: Internal Medicine

## 2014-04-14 ENCOUNTER — Other Ambulatory Visit (HOSPITAL_COMMUNITY): Payer: Self-pay | Admitting: Internal Medicine

## 2014-04-14 DIAGNOSIS — J449 Chronic obstructive pulmonary disease, unspecified: Secondary | ICD-10-CM

## 2014-04-14 DIAGNOSIS — R918 Other nonspecific abnormal finding of lung field: Secondary | ICD-10-CM | POA: Diagnosis not present

## 2014-04-14 DIAGNOSIS — J42 Unspecified chronic bronchitis: Secondary | ICD-10-CM | POA: Diagnosis not present

## 2014-04-14 IMAGING — CR DG CHEST 2V
2 series · 2 of 2 positions shown · non-contrast
Comparison: [DATE]

CLINICAL DATA: COPD, chronic bronchitis, difficulty breathing

EXAM:
CHEST  2 VIEW

[chest pa]
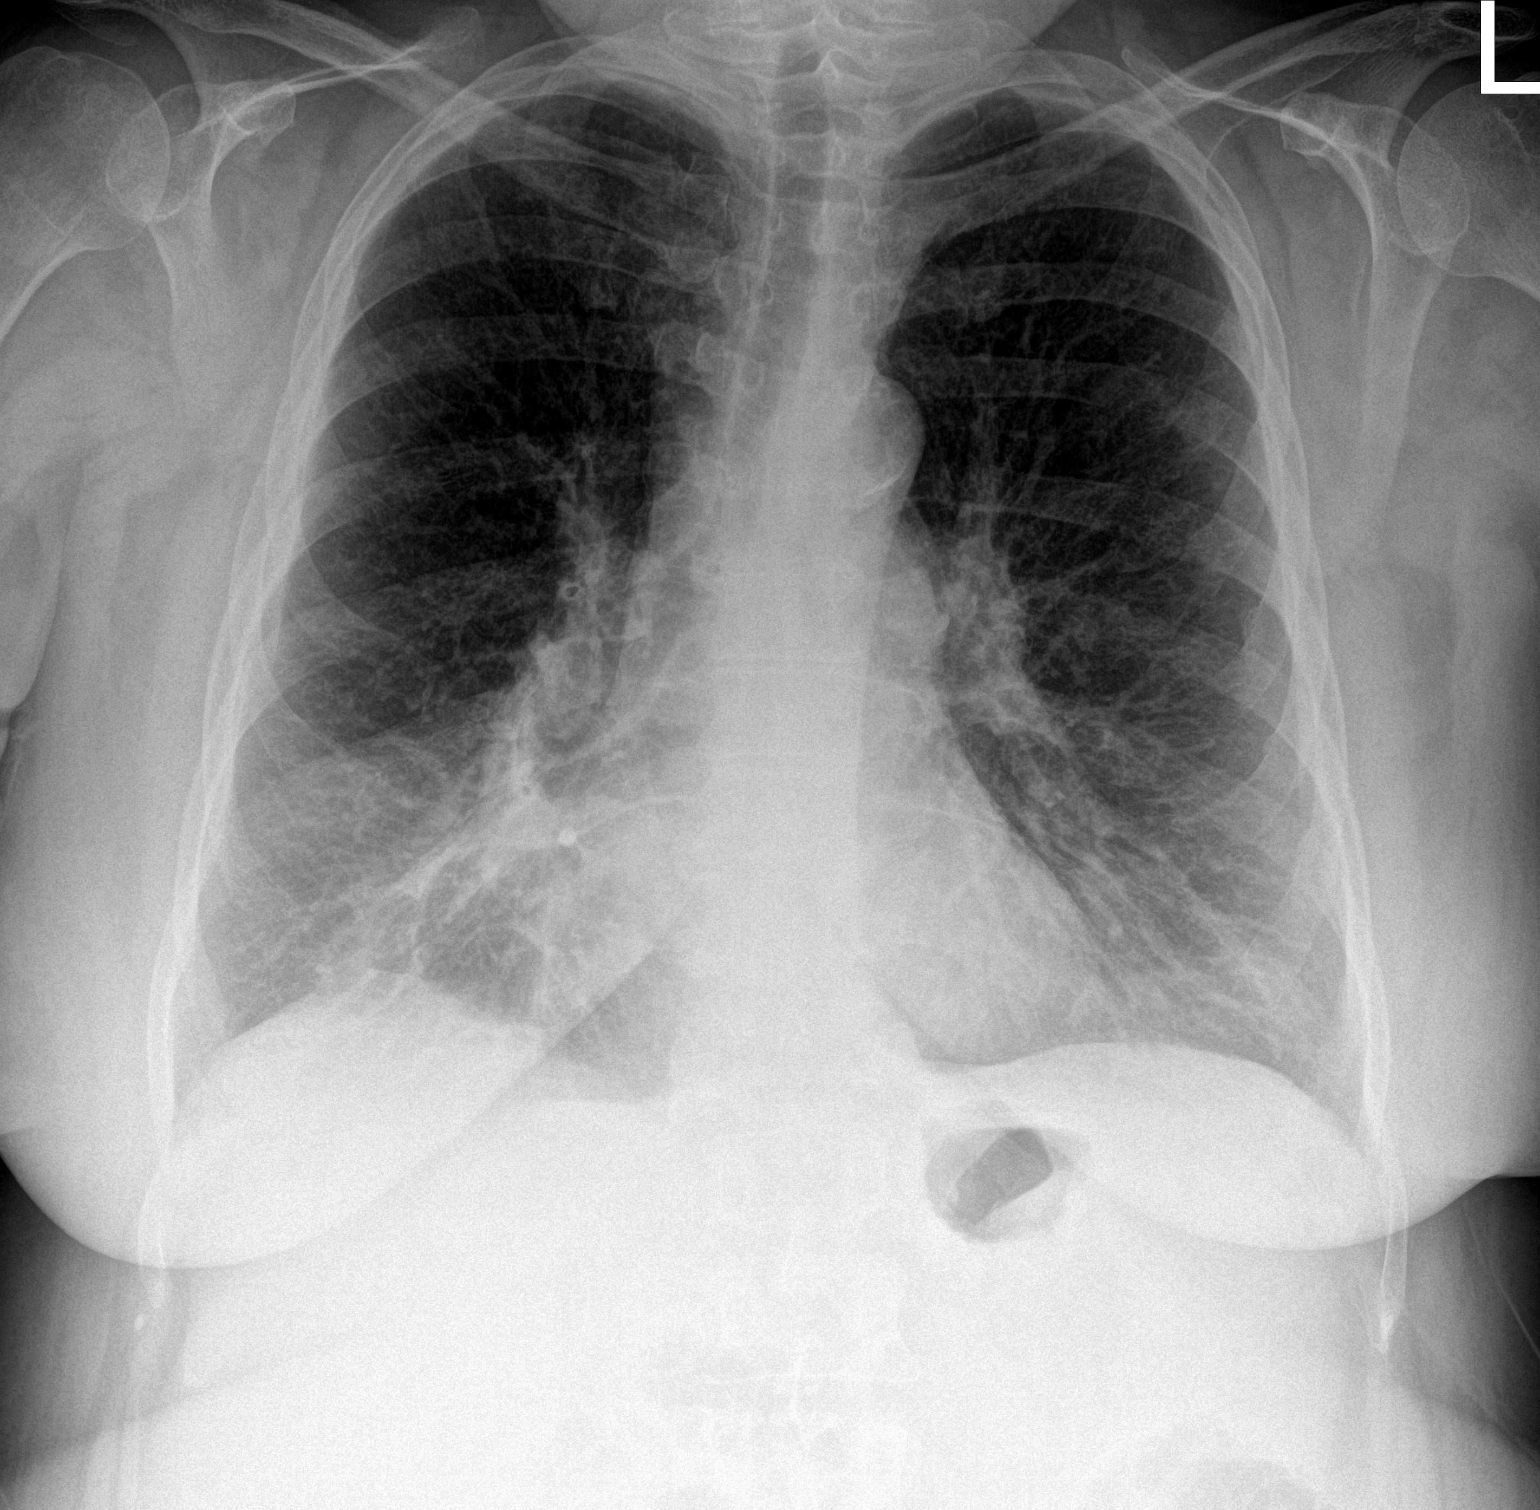

[chest lat]
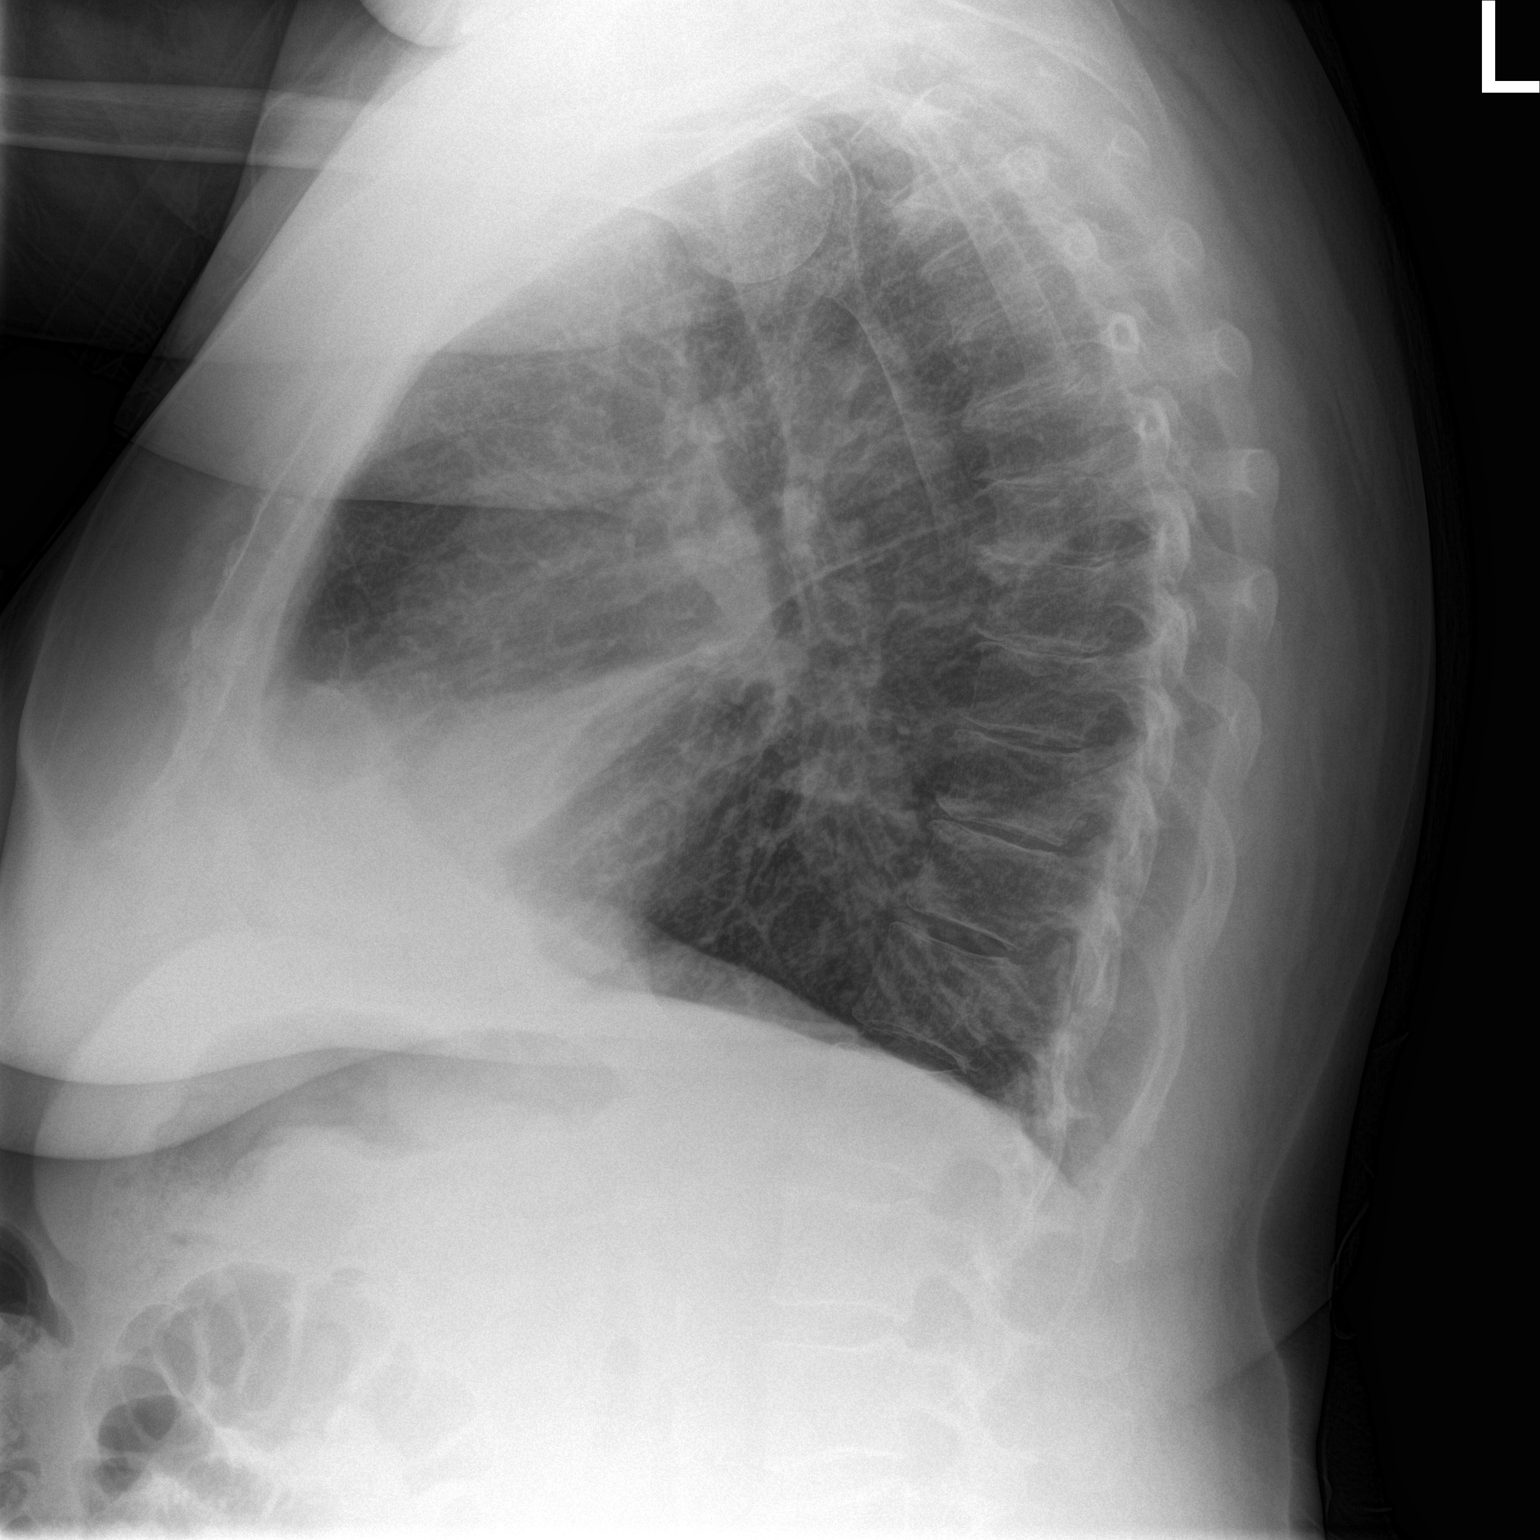

[2 of 2 positions shown; findings below may reference images not displayed]

FINDINGS: Cardiomediastinal silhouette is stable. Mild hyperinflation. Central
mild bronchitic changes. Persistent atelectasis or infiltrate in
right middle lobe. Follow-up to resolution is recommended. Mild
degenerative changes mid thoracic spine.
IMPRESSION: Hyperinflation. Central mild bronchitic changes. Persistent
atelectasis or infiltrate in right middle lobe. Follow-up to
resolution is recommended.

## 2014-05-04 ENCOUNTER — Other Ambulatory Visit: Payer: Self-pay | Admitting: *Deleted

## 2014-05-04 MED ORDER — CLOPIDOGREL BISULFATE 75 MG PO TABS: 75.0000 mg | ORAL_TABLET | Freq: Every day | ORAL | Status: DC

## 2014-05-04 NOTE — Telephone Encounter (Signed)
RX sent to patients pharmacy

## 2014-06-23 ENCOUNTER — Other Ambulatory Visit: Payer: Self-pay

## 2014-06-23 MED ORDER — CLOPIDOGREL BISULFATE 75 MG PO TABS: 75.0000 mg | ORAL_TABLET | Freq: Every day | ORAL | Status: DC

## 2014-06-23 NOTE — Telephone Encounter (Signed)
Rx(s) sent to pharmacy electronically.  

## 2014-11-11 ENCOUNTER — Encounter: Payer: Self-pay | Admitting: Cardiovascular Disease

## 2014-11-11 ENCOUNTER — Ambulatory Visit (INDEPENDENT_AMBULATORY_CARE_PROVIDER_SITE_OTHER): Payer: Medicare Other | Admitting: Cardiovascular Disease

## 2014-11-11 VITALS — BP 158/72 | HR 75 | Ht 67.0 in | Wt 227.0 lb

## 2014-11-11 DIAGNOSIS — I739 Peripheral vascular disease, unspecified: Secondary | ICD-10-CM | POA: Diagnosis not present

## 2014-11-11 DIAGNOSIS — E785 Hyperlipidemia, unspecified: Secondary | ICD-10-CM | POA: Diagnosis not present

## 2014-11-11 DIAGNOSIS — Z9889 Other specified postprocedural states: Secondary | ICD-10-CM

## 2014-11-11 DIAGNOSIS — I10 Essential (primary) hypertension: Secondary | ICD-10-CM | POA: Diagnosis not present

## 2014-11-11 DIAGNOSIS — Z959 Presence of cardiac and vascular implant and graft, unspecified: Secondary | ICD-10-CM

## 2014-11-11 NOTE — Assessment & Plan Note (Addendum)
History of hypertension blood pressure measured today at 158/72. She is on lisinopril, and hydrochlorothiazide. Continue current medications

## 2014-11-11 NOTE — Progress Notes (Signed)
11/11/2014 Jackie Evans   Feb 01, 1956  [id number]  Primary Physician Philis Fendt, MD Primary Cardiologist: Lorretta Harp MD Renae Gloss   HPI:  Jackie Evans is a 59 year old moderately overweight divorced Caucasian female mother of 71, grandmother to 76 grandchildren who is referred by Dr. Paulla Dolly for peripheral vascular evaluation because of critical limb ischemia.I last saw her in the office 11/11/13. Her primary care physician is Dr. Sofie Hartigan. Her risk factors include tobacco abuse of 40-80 pack years with COPD. She does continue to smoke one half pack per day and is oxygen dependent. She is treated diabetes, hypertension and hyperlipidemia. She's never had a heart attack or stroke. She denies chest pain but is short of breath. She's had left lower extremity discomfort for one to 2 months but at rest and with exertion. Her left ABI Was .66 on the left. 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 A recanalized chronic total occlusion with antegrade and retrograde and placed to self expanding stent. Her ABI subsequently improved to 0.83 on the left with resolution of her claudication symptoms. Since I saw her a year ago she should remain critically stable. Her lower extremity arterial Doppler studies performed 12/04/13 revealed a decline in her left ABI to 0.53. I'm concerned that her stent has occluded in the interim although she is asymptomatic.  Current Outpatient Prescriptions  Medication Sig Dispense Refill  . albuterol (PROAIR HFA) 108 (90 BASE) MCG/ACT inhaler Inhale 1 puff into the lungs every 6 (six) hours as needed for wheezing or shortness of breath.    [name] aspirin 81 MG chewable tablet Chew 1 tablet (81 mg total) by mouth daily.    . clopidogrel (PLAVIX) 75 MG tablet Take 1 tablet (75 mg total) by mouth daily with breakfast. 30 tablet 4  .  gabapentin (NEURONTIN) 300 MG capsule Take 300 mg by mouth 3 (three) times daily.    [name] glipiZIDE (GLUCOTROL XL) 5 MG 24 hr tablet Take 5 mg by mouth daily with breakfast.    . lisinopril-hydrochlorothiazide (PRINZIDE,ZESTORETIC) 20-12.5 MG per tablet Take 1 tablet by mouth 2 (two) times daily.     . metFORMIN (GLUCOPHAGE) 1000 MG tablet Take 0.5-1 tablets (500-1,000 mg total) by mouth 3 (three) times daily. Patient takes 1 tablet in the morning, 1/2 tablet in the evening and 1 tablet at bedtime    . nicotine (NICODERM CQ) 14 mg/24hr patch Place 1 patch (14 mg total) onto the skin daily. 28 patch 0  . nicotine (NICODERM CQ) 7 mg/24hr patch Place 1 patch (7 mg total) onto the skin daily. 28 patch 0  . simvastatin (ZOCOR) 10 MG tablet Take 10 mg by mouth daily.    . traZODone (DESYREL) 100 MG tablet Take 1 tablet by mouth at bedtime.     No current facility-administered medications for this visit.    No Known Allergies  History   Social History  . Marital Status: Single    Spouse Name: N/A  . Number of Children: N/A  . Years of Education: N/A   Occupational History  . Not on file.   Social History Main Topics  . Smoking status: Current Every Day Smoker -- 0.50 packs/day for 40 years  . Smokeless tobacco: Never Used  . Alcohol Use: No  . Drug Use: No  . Sexual Activity: Not Currently   Other Topics Concern  . Not  on file   Social History Narrative     Review of Systems: General: negative for chills, fever, night sweats or weight changes.  Cardiovascular: negative for chest pain, dyspnea on exertion, edema, orthopnea, palpitations, paroxysmal nocturnal dyspnea or shortness of breath Dermatological: negative for rash Respiratory: negative for cough or wheezing Urologic: negative for hematuria Abdominal: negative for nausea, vomiting, diarrhea, bright red blood per rectum, melena, or hematemesis Neurologic: negative for visual changes, syncope, or dizziness All other systems  reviewed and are otherwise negative except as noted above.    Blood pressure 158/72, pulse 75, height 5\' 7"  (1.702 m), weight 227 lb (102.967 kg), last menstrual period 04/25/1999.  General appearance: alert and no distress Neck: no adenopathy, no carotid bruit, no JVD, supple, symmetrical, trachea midline and thyroid not enlarged, symmetric, no tenderness/mass/nodules Lungs: clear to auscultation bilaterally Heart: regular rate and rhythm, S1, S2 normal, no murmur, click, rub or gallop Extremities: extremities normal, atraumatic, no cyanosis or edema and absent left pedal pulse  EKG normal sinus rhythm at 75 with septal Q waves. I personally reviewed this EKG  ASSESSMENT AND PLAN:   S/P arterial stent, Lt Ext iliac nitinol self exp. stent and in common iliac 04/08/14 History of peripheral arterial disease status post left common and external iliac artery stenting by myself 04/08/13 secondary to claudication with an occluded left external iliac artery. Her follow-up Dopplers performed in January showed an improved ABI to 0.83 and a left however follow-up Dopplers performed 6 months later revealed a decline in her ABI to 0.53. She really denies claudication. We will repeat lower extremity arterial Doppler studies  Hyperlipidemia History of hyperlipidemia on simvastatin followed by her PCP  Essential hypertension History of hypertension blood pressure measured today at 158/72. She is on lisinopril, and hydrochlorothiazide. Continue current medications      Lorretta Harp MD [facility], [facility] 11/11/2014 3:44 PM

## 2014-11-11 NOTE — Assessment & Plan Note (Signed)
History of hyperlipidemia on simvastatin followed by her PCP 

## 2014-11-11 NOTE — Patient Instructions (Signed)
  We will see you back in follow up in 1 year with Dr Gwenlyn Found.   Dr Gwenlyn Found has ordered: Lower extremity arterial doppler- During this test, ultrasound is used to evaluate arterial blood flow in the legs. Allow approximately one hour for this exam.

## 2014-11-11 NOTE — Assessment & Plan Note (Signed)
History of peripheral arterial disease status post left common and external iliac artery stenting by myself 04/08/13 secondary to claudication with an occluded left external iliac artery. Her follow-up Dopplers performed in January showed an improved ABI to 0.83 and a left however follow-up Dopplers performed 6 months later revealed a decline in her ABI to 0.53. She really denies claudication. We will repeat lower extremity arterial Doppler studies

## 2014-11-16 ENCOUNTER — Other Ambulatory Visit: Payer: Self-pay | Admitting: Cardiovascular Disease

## 2014-11-16 DIAGNOSIS — I739 Peripheral vascular disease, unspecified: Secondary | ICD-10-CM

## 2014-11-24 ENCOUNTER — Inpatient Hospital Stay (HOSPITAL_COMMUNITY): Admission: RE | Admit: 2014-11-24 | Payer: Medicare Other | Source: Ambulatory Visit

## 2014-12-01 ENCOUNTER — Encounter (HOSPITAL_COMMUNITY): Payer: Medicare Other

## 2014-12-15 ENCOUNTER — Ambulatory Visit (HOSPITAL_COMMUNITY)
Admission: RE | Admit: 2014-12-15 | Discharge: 2014-12-15 | Disposition: A | Discharge status: Home / Self Care | Payer: Medicare Other | Source: Ambulatory Visit | Attending: Cardiology | Admitting: Cardiology

## 2014-12-15 DIAGNOSIS — I700 Atherosclerosis of aorta: Secondary | ICD-10-CM | POA: Diagnosis not present

## 2014-12-15 DIAGNOSIS — I70203 Unspecified atherosclerosis of native arteries of extremities, bilateral legs: Secondary | ICD-10-CM | POA: Insufficient documentation

## 2014-12-15 DIAGNOSIS — I739 Peripheral vascular disease, unspecified: Secondary | ICD-10-CM | POA: Insufficient documentation

## 2014-12-15 DIAGNOSIS — E785 Hyperlipidemia, unspecified: Secondary | ICD-10-CM | POA: Insufficient documentation

## 2014-12-15 DIAGNOSIS — I10 Essential (primary) hypertension: Secondary | ICD-10-CM | POA: Diagnosis not present

## 2014-12-15 DIAGNOSIS — Z48812 Encounter for surgical aftercare following surgery on the circulatory system: Secondary | ICD-10-CM | POA: Insufficient documentation

## 2014-12-15 DIAGNOSIS — J449 Chronic obstructive pulmonary disease, unspecified: Secondary | ICD-10-CM | POA: Diagnosis not present

## 2014-12-15 DIAGNOSIS — E119 Type 2 diabetes mellitus without complications: Secondary | ICD-10-CM | POA: Diagnosis not present

## 2014-12-22 ENCOUNTER — Other Ambulatory Visit: Payer: Self-pay | Admitting: *Deleted

## 2014-12-23 ENCOUNTER — Other Ambulatory Visit: Payer: Self-pay | Admitting: *Deleted

## 2014-12-23 MED ORDER — CLOPIDOGREL BISULFATE 75 MG PO TABS: 75.0000 mg | ORAL_TABLET | Freq: Every day | ORAL | Status: DC

## 2015-01-26 ENCOUNTER — Other Ambulatory Visit: Payer: Self-pay | Admitting: Cardiovascular Disease

## 2015-01-26 DIAGNOSIS — I739 Peripheral vascular disease, unspecified: Secondary | ICD-10-CM

## 2015-01-28 ENCOUNTER — Inpatient Hospital Stay (HOSPITAL_COMMUNITY): Admission: RE | Admit: 2015-01-28 | Payer: Medicare Other | Source: Ambulatory Visit

## 2015-01-29 ENCOUNTER — Other Ambulatory Visit (HOSPITAL_COMMUNITY): Payer: Self-pay | Admitting: Internal Medicine

## 2015-01-29 DIAGNOSIS — Z1231 Encounter for screening mammogram for malignant neoplasm of breast: Secondary | ICD-10-CM

## 2015-02-04 ENCOUNTER — Ambulatory Visit (HOSPITAL_COMMUNITY): Payer: Medicare Other

## 2015-02-10 ENCOUNTER — Ambulatory Visit (HOSPITAL_COMMUNITY): Payer: Medicare Other

## 2015-02-17 ENCOUNTER — Ambulatory Visit (HOSPITAL_COMMUNITY)
Admission: RE | Admit: 2015-02-17 | Discharge: 2015-02-17 | Disposition: A | Discharge status: Home / Self Care | Payer: Medicare Other | Source: Ambulatory Visit | Attending: Internal Medicine | Admitting: Internal Medicine

## 2015-02-17 DIAGNOSIS — Z1231 Encounter for screening mammogram for malignant neoplasm of breast: Secondary | ICD-10-CM | POA: Diagnosis present

## 2015-11-16 ENCOUNTER — Ambulatory Visit: Payer: Medicare Other | Admitting: Cardiovascular Disease

## 2015-12-13 ENCOUNTER — Encounter: Payer: Self-pay | Admitting: Cardiology

## 2015-12-28 ENCOUNTER — Encounter: Payer: Self-pay | Admitting: Cardiovascular Disease

## 2015-12-28 ENCOUNTER — Ambulatory Visit (INDEPENDENT_AMBULATORY_CARE_PROVIDER_SITE_OTHER): Payer: Medicare Other | Admitting: Cardiovascular Disease

## 2015-12-28 VITALS — BP 144/62 | HR 72 | Ht 67.0 in | Wt 232.0 lb

## 2015-12-28 DIAGNOSIS — I10 Essential (primary) hypertension: Secondary | ICD-10-CM

## 2015-12-28 DIAGNOSIS — I998 Other disorder of circulatory system: Secondary | ICD-10-CM | POA: Diagnosis not present

## 2015-12-28 DIAGNOSIS — E785 Hyperlipidemia, unspecified: Secondary | ICD-10-CM

## 2015-12-28 DIAGNOSIS — I70229 Atherosclerosis of native arteries of extremities with rest pain, unspecified extremity: Secondary | ICD-10-CM

## 2015-12-28 MED ORDER — CLOPIDOGREL BISULFATE 75 MG PO TABS: 75.0000 mg | ORAL_TABLET | Freq: Every day | ORAL | 3 refills | Status: DC

## 2015-12-28 NOTE — Assessment & Plan Note (Signed)
History of critical limb ischemia originally referred to me by Dr. Felisa Bonier. 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 She had 3 vessel runoff at that time. Her wound subsequently healed. When I saw her a year ago her ABI had again fallen 0.83to  0.53 although she currently denies claudication. I suspect her iliacs and has subsequently occluded. She is more limited by shortness of breath from her COPD and really does not walk much.

## 2015-12-28 NOTE — Assessment & Plan Note (Signed)
History of hypertension blood pressure measured 144/62. She is on lisinopril and hydrochlorothiazide. Continue current meds at current dosing

## 2015-12-28 NOTE — Assessment & Plan Note (Signed)
History of hyperlipidemia on statin therapy followed by her PCP. 

## 2015-12-28 NOTE — Progress Notes (Signed)
12/28/2015 Jackie Evans   1955-06-16  [id number]  Primary Physician Philis Fendt, MD Primary Cardiologist: Lorretta Harp MD Renae Gloss  HPI:   [name] is a 60 year old moderately overweight divorced Caucasian female mother of 44, grandmother to 31 grandchildren who is referred by Dr. Paulla Dolly for peripheral vascular evaluation because of critical limb ischemia.I last saw her in the office 11/11/14.[name] Her primary care physician is Dr. Sofie Hartigan. Her risk factors include tobacco abuse of 40-80 pack years with COPD. She does continue to smoke one half pack per day and is oxygen dependent. She is treated diabetes, hypertension and hyperlipidemia. She's never had a heart attack or stroke. She denies chest pain but is short of breath. She's had left lower extremity discomfort for one to 2 months but at rest and with exertion. Her left ABI Was .66 on the left. 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 A recanalized chronic total occlusion with antegrade and retrograde and placed to self expanding stent. Her ABI subsequently improved to 0.83 on the left with resolution of her claudication symptoms. Since I saw her a year ago she has remained clinically stable. Her lower extremity arterial Doppler studies performed 12/04/13 revealed a decline in her left ABI to 0.53. I suspect she occluded her iliac stent although she really denies claudication and doesn't walk that far principally limited by her COPD.   Current Outpatient Prescriptions  Medication Sig Dispense Refill  . albuterol (PROAIR HFA) 108 (90 BASE) MCG/ACT inhaler Inhale 1 puff into the lungs every 6 (six) hours as needed for wheezing or shortness of breath.    [name] aspirin 81 MG chewable tablet Chew 1 tablet (81 mg total) by mouth daily.    . clopidogrel (PLAVIX) 75 MG tablet Take 1 tablet (75 mg total)  by mouth daily with breakfast. 90 tablet 3  . gabapentin (NEURONTIN) 300 MG capsule Take 300 mg by mouth 3 (three) times daily.    [name] glipiZIDE (GLUCOTROL XL) 5 MG 24 hr tablet Take 5 mg by mouth daily with breakfast.    . lisinopril-hydrochlorothiazide (PRINZIDE,ZESTORETIC) 20-12.5 MG per tablet Take 1 tablet by mouth 2 (two) times daily.     . metFORMIN (GLUCOPHAGE) 1000 MG tablet Take 0.5-1 tablets (500-1,000 mg total) by mouth 3 (three) times daily. Patient takes 1 tablet in the morning, 1/2 tablet in the evening and 1 tablet at bedtime    . nicotine (NICODERM CQ) 14 mg/24hr patch Place 1 patch (14 mg total) onto the skin daily. 28 patch 0  . nicotine (NICODERM CQ) 7 mg/24hr patch Place 1 patch (7 mg total) onto the skin daily. 28 patch 0  . simvastatin (ZOCOR) 10 MG tablet Take 10 mg by mouth daily.    . traZODone (DESYREL) 100 MG tablet Take 1 tablet by mouth at bedtime.     No current facility-administered medications for this visit.     No Known Allergies  Social History   Social History  . Marital status: Single    Spouse name: N/A  . Number of children: N/A  . Years of education: N/A   Occupational History  . Not on file.   Social History Main Topics  . Smoking status: Current Every Day Smoker    Packs/day: 0.50    Years: 40.00  . Smokeless tobacco: Never Used  . Alcohol use No  .  Drug use: No  . Sexual activity: Not Currently   Other Topics Concern  . Not on file   Social History Narrative  . No narrative on file     Review of Systems: General: negative for chills, fever, night sweats or weight changes.  Cardiovascular: negative for chest pain, dyspnea on exertion, edema, orthopnea, palpitations, paroxysmal nocturnal dyspnea or shortness of breath Dermatological: negative for rash Respiratory: negative for cough or wheezing Urologic: negative for hematuria Abdominal: negative for nausea, vomiting, diarrhea, bright red blood per rectum, melena, or  hematemesis Neurologic: negative for visual changes, syncope, or dizziness All other systems reviewed and are otherwise negative except as noted above.    Blood pressure (!) 144/62, pulse 72, height 5\' 7"  (1.702 m), weight 232 lb (105.2 kg), last menstrual period 04/25/1999.  General appearance: alert and no distress Neck: no adenopathy, no carotid bruit, no JVD, supple, symmetrical, trachea midline and thyroid not enlarged, symmetric, no tenderness/mass/nodules Lungs: clear to auscultation bilaterally Heart: regular rate and rhythm, S1, S2 normal, no murmur, click, rub or gallop Extremities: extremities normal, atraumatic, no cyanosis or edema  EKG normal sinus rhythm at 72 with septal Q waves and low limb voltage. I personally reviewed this EKG  ASSESSMENT AND PLAN:   Essential hypertension History of hypertension blood pressure measured 144/62. She is on lisinopril and hydrochlorothiazide. Continue current meds at current dosing  Hyperlipidemia History of hyperlipidemia on statin therapy followed by her PCP  Critical lower limb ischemia History of critical limb ischemia originally referred to me by Dr. Felisa Bonier. 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 She had 3 vessel runoff at that time. Her wound subsequently healed. When I saw her a year ago her ABI had again fallen 0.83to  0.53 although she currently denies claudication. I suspect her iliacs and has subsequently occluded. She is more limited by shortness of breath from her COPD and really does not walk much.      Lorretta Harp MD FACP,FACC,FAHA, [facility] 12/28/2015 2:41 PM

## 2015-12-28 NOTE — Patient Instructions (Signed)

## 2016-02-14 ENCOUNTER — Other Ambulatory Visit (HOSPITAL_COMMUNITY): Payer: Self-pay | Admitting: Internal Medicine

## 2016-02-14 DIAGNOSIS — Z1231 Encounter for screening mammogram for malignant neoplasm of breast: Secondary | ICD-10-CM

## 2016-02-24 ENCOUNTER — Ambulatory Visit (HOSPITAL_COMMUNITY)
Admission: RE | Admit: 2016-02-24 | Discharge: 2016-02-24 | Disposition: A | Discharge status: Home / Self Care | Payer: Medicare Other | Source: Ambulatory Visit | Attending: Internal Medicine | Admitting: Internal Medicine

## 2016-02-24 DIAGNOSIS — Z1231 Encounter for screening mammogram for malignant neoplasm of breast: Secondary | ICD-10-CM | POA: Insufficient documentation

## 2016-04-07 ENCOUNTER — Other Ambulatory Visit: Payer: Self-pay | Admitting: Internal Medicine

## 2016-04-07 DIAGNOSIS — E2839 Other primary ovarian failure: Secondary | ICD-10-CM

## 2016-10-18 ENCOUNTER — Other Ambulatory Visit: Payer: Self-pay | Admitting: Internal Medicine

## 2016-10-18 DIAGNOSIS — E2839 Other primary ovarian failure: Secondary | ICD-10-CM

## 2016-10-27 ENCOUNTER — Inpatient Hospital Stay
Admission: RE | Admit: 2016-10-27 | Discharge: 2016-10-27 | Disposition: A | Discharge status: Home / Self Care | Payer: Medicare Other | Source: Ambulatory Visit | Attending: Internal Medicine | Admitting: Internal Medicine

## 2016-11-02 ENCOUNTER — Ambulatory Visit
Admission: RE | Admit: 2016-11-02 | Discharge: 2016-11-02 | Disposition: A | Discharge status: Home / Self Care | Payer: Medicare Other | Source: Ambulatory Visit | Attending: Internal Medicine | Admitting: Internal Medicine

## 2016-11-02 DIAGNOSIS — E2839 Other primary ovarian failure: Secondary | ICD-10-CM

## 2016-12-29 ENCOUNTER — Encounter: Payer: Self-pay | Admitting: *Deleted

## 2017-01-02 ENCOUNTER — Other Ambulatory Visit: Payer: Self-pay

## 2017-01-02 MED ORDER — CLOPIDOGREL BISULFATE 75 MG PO TABS: 75.0000 mg | ORAL_TABLET | Freq: Every day | ORAL | 0 refills | Status: DC

## 2017-01-05 ENCOUNTER — Ambulatory Visit: Payer: Medicare Other | Admitting: Cardiovascular Disease

## 2017-01-12 ENCOUNTER — Other Ambulatory Visit (HOSPITAL_COMMUNITY): Payer: Self-pay | Admitting: Internal Medicine

## 2017-01-12 DIAGNOSIS — Z1231 Encounter for screening mammogram for malignant neoplasm of breast: Secondary | ICD-10-CM

## 2017-02-14 ENCOUNTER — Ambulatory Visit: Payer: Medicare Other | Admitting: Cardiovascular Disease

## 2017-02-28 ENCOUNTER — Ambulatory Visit (HOSPITAL_COMMUNITY)
Admission: RE | Admit: 2017-02-28 | Discharge: 2017-02-28 | Disposition: A | Discharge status: Home / Self Care | Payer: Medicare Other | Source: Ambulatory Visit | Attending: Internal Medicine | Admitting: Internal Medicine

## 2017-02-28 DIAGNOSIS — Z1231 Encounter for screening mammogram for malignant neoplasm of breast: Secondary | ICD-10-CM | POA: Diagnosis not present

## 2017-03-07 ENCOUNTER — Encounter: Payer: Self-pay | Admitting: Cardiovascular Disease

## 2017-03-07 ENCOUNTER — Ambulatory Visit (INDEPENDENT_AMBULATORY_CARE_PROVIDER_SITE_OTHER): Payer: Medicare Other | Admitting: Cardiovascular Disease

## 2017-03-07 VITALS — BP 132/68 | HR 70 | Ht 65.0 in | Wt 194.6 lb

## 2017-03-07 DIAGNOSIS — I739 Peripheral vascular disease, unspecified: Secondary | ICD-10-CM | POA: Diagnosis not present

## 2017-03-07 DIAGNOSIS — I10 Essential (primary) hypertension: Secondary | ICD-10-CM

## 2017-03-07 NOTE — Patient Instructions (Signed)
Medication Instructions: Your physician recommends that you continue on your current medications as directed. Please refer to the Current Medication list given to you today.  Labwork: I will request recent lab work from Dr. Jeanie Cooks.   Follow-Up: Your physician wants you to follow-up in: 1 year with Dr. Gwenlyn Found. You will receive a reminder letter in the mail two months in advance. If you don't receive a letter, please call our office to schedule the follow-up appointment.  If you need a refill on your cardiac medications before your next appointment, please call your pharmacy.

## 2017-03-07 NOTE — Assessment & Plan Note (Signed)
History of essential hypertension blood pressure 130/68. She is on lisinopril, hydrochlorothiazide. Continue current meds at current dosing.

## 2017-03-07 NOTE — Progress Notes (Signed)
03/07/2017 Jackie Evans   18-Oct-1955  [id number]  Primary Physician Nolene Ebbs, MD Primary Cardiologist: Lorretta Harp MD Lupe Carney, [place]  HPI:  Jackie Evans is a 61 y.o. female moderately overweight divorced Caucasian female mother of 46, grandmother to 27 grandchildren who is referred by Dr. Paulla Dolly for peripheral vascular evaluation because of critical limb ischemia.I last saw her in the office  12/28/15.[name] Her primary care physician is Dr. Sofie Hartigan. Her risk factors include tobacco abuse of 40-80 pack years with COPD. She does continue to smoke one half pack per day and is oxygen dependent. She is treated diabetes, hypertension and hyperlipidemia. She's never had a heart attack or stroke. She denies chest pain but is short of breath. She's had left lower extremity discomfort for one to 2 months but at rest and with exertion. Her left ABI Was .66 on the left. 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 A recanalized chronic total occlusion with antegrade and retrograde and placed to self expanding stent. Her ABI subsequently improved to 0.83 on the left with resolution of her claudication symptoms. Since I saw her a year ago she has remained clinically stable. Her lower extremity arterial Doppler studies performed 12/04/13 revealed a decline in her left ABI to 0.53. I suspect she occluded her iliac stent although she really denies claudication and doesn't walk that far principally limited by her COPD. Since I saw her a year ago she has remained asymptomatic with denying chest pain, shortness of breath or claudication.    Current Meds  Medication Sig  . albuterol (PROAIR HFA) 108 (90 BASE) MCG/ACT inhaler Inhale 1 puff into the lungs every 6 (six) hours as needed for wheezing or shortness of breath.  [name] aspirin 81 MG chewable tablet Chew 1 tablet (81 mg  total) by mouth daily.  . clopidogrel (PLAVIX) 75 MG tablet Take 1 tablet (75 mg total) by mouth daily with breakfast.  . gabapentin (NEURONTIN) 300 MG capsule Take 300 mg by mouth 3 (three) times daily.  [name] glipiZIDE (GLUCOTROL XL) 5 MG 24 hr tablet Take 5 mg by mouth daily with breakfast.  . lisinopril-hydrochlorothiazide (PRINZIDE,ZESTORETIC) 20-12.5 MG per tablet Take 1 tablet by mouth 2 (two) times daily.   . metFORMIN (GLUCOPHAGE) 1000 MG tablet Take 0.5-1 tablets (500-1,000 mg total) by mouth 3 (three) times daily. Patient takes 1 tablet in the morning, 1/2 tablet in the evening and 1 tablet at bedtime  . nicotine (NICODERM CQ) 14 mg/24hr patch Place 1 patch (14 mg total) onto the skin daily.  . nicotine (NICODERM CQ) 7 mg/24hr patch Place 1 patch (7 mg total) onto the skin daily.  . simvastatin (ZOCOR) 10 MG tablet Take 10 mg by mouth daily.  . traZODone (DESYREL) 100 MG tablet Take 1 tablet by mouth at bedtime.     No Known Allergies  Social History   Socioeconomic History  . Marital status: Single    Spouse name: Not on file  . Number of children: Not on file  . Years of education: Not on file  . Highest education level: Not on file  Social Needs  . Financial resource strain: Not on file  . Food insecurity - worry: Not on file  . Food insecurity - inability: Not on file  . Transportation needs - medical: Not on file  . Transportation needs - non-medical: Not on  file  Occupational History  . Not on file  Tobacco Use  . Smoking status: Current Every Day Smoker    Packs/day: 0.50    Years: 40.00    Pack years: 20.00  . Smokeless tobacco: Never Used  Substance and Sexual Activity  . Alcohol use: No  . Drug use: No  . Sexual activity: Not Currently  Other Topics Concern  . Not on file  Social History Narrative  . Not on file     Review of Systems: General: negative for chills, fever, night sweats or weight changes.  Cardiovascular: negative for chest pain, dyspnea  on exertion, edema, orthopnea, palpitations, paroxysmal nocturnal dyspnea or shortness of breath Dermatological: negative for rash Respiratory: negative for cough or wheezing Urologic: negative for hematuria Abdominal: negative for nausea, vomiting, diarrhea, bright red blood per rectum, melena, or hematemesis Neurologic: negative for visual changes, syncope, or dizziness All other systems reviewed and are otherwise negative except as noted above.    Blood pressure 132/68, pulse 70, height 5\' 5"  (1.651 m), weight 194 lb 9.6 oz (88.3 kg), last menstrual period 04/25/1999.  General appearance: alert and no distress Neck: no adenopathy, no carotid bruit, no JVD, supple, symmetrical, trachea midline and thyroid not enlarged, symmetric, no tenderness/mass/nodules Lungs: clear to auscultation bilaterally Heart: regular rate and rhythm, S1, S2 normal, no murmur, click, rub or gallop Extremities: extremities normal, atraumatic, no cyanosis or edema Pulses: Diminished left pedal pulse Skin: Skin color, texture, turgor normal. No rashes or lesions Neurologic: Alert and oriented X 3, normal strength and tone. Normal symmetric reflexes. Normal coordination and gait  EKG sinus rhythm at 70 without ST or T-wave changes. There were septal Q waves noted. I personally reviewed this EKG.  ASSESSMENT AND PLAN:   Essential hypertension History of essential hypertension blood pressure 130/68. She is on lisinopril, hydrochlorothiazide. Continue current meds at current dosing.  Hyperlipidemia History of hyperlipidemia on statin therapy followed by her PCP  PAD (peripheral artery disease) History of peripheral arterial disease with resting pain back in 2014.  She is status post right common and external iliac artery PTA and stenting for a left external iliac artery CTO. Her ABI all totally improved after her procedure from 0.6 6.83 but afterwards declined again the 0.53 by ultrasound performed 12/15/14  although she is currently asymptomatic.      Lorretta Harp MD FACP,FACC,FAHA, [id number] 03/07/2017 4:33 PM

## 2017-03-07 NOTE — Assessment & Plan Note (Signed)
History of peripheral arterial disease with resting pain back in 2014.  She is status post right common and external iliac artery PTA and stenting for a left external iliac artery CTO. Her ABI all totally improved after her procedure from 0.6 6.83 but afterwards declined again the 0.53 by ultrasound performed 12/15/14 although she is currently asymptomatic.

## 2017-03-07 NOTE — Assessment & Plan Note (Signed)
History of hyperlipidemia on statin therapy followed by her PCP. 

## 2017-03-13 ENCOUNTER — Ambulatory Visit (INDEPENDENT_AMBULATORY_CARE_PROVIDER_SITE_OTHER): Payer: Medicare Other | Admitting: Specialist

## 2017-05-22 ENCOUNTER — Other Ambulatory Visit: Payer: Self-pay

## 2017-05-22 MED ORDER — CLOPIDOGREL BISULFATE 75 MG PO TABS: 75.0000 mg | ORAL_TABLET | Freq: Every day | ORAL | 3 refills | Status: DC

## 2017-05-22 NOTE — Telephone Encounter (Signed)
Rx(s) sent to pharmacy electronically.  

## 2018-04-15 ENCOUNTER — Other Ambulatory Visit: Payer: Self-pay | Admitting: Cardiovascular Disease
